# Patient Record
Sex: Male | Born: 1960
Health system: Southern US, Community
[De-identification: ages and names within clinical notes are randomized; demographics above are authoritative.]

## PROBLEM LIST (undated history)

## (undated) DIAGNOSIS — R011 Cardiac murmur, unspecified: Secondary | ICD-10-CM

## (undated) DIAGNOSIS — K219 Gastro-esophageal reflux disease without esophagitis: Secondary | ICD-10-CM

## (undated) DIAGNOSIS — G43909 Migraine, unspecified, not intractable, without status migrainosus: Secondary | ICD-10-CM

## (undated) DIAGNOSIS — E785 Hyperlipidemia, unspecified: Secondary | ICD-10-CM

## (undated) DIAGNOSIS — T7840XA Allergy, unspecified, initial encounter: Secondary | ICD-10-CM

## (undated) HISTORY — DX: Migraine, unspecified, not intractable, without status migrainosus: G43.909

## (undated) HISTORY — DX: Allergy, unspecified, initial encounter: T78.40XA

## (undated) HISTORY — PX: POLYPECTOMY: SHX149

## (undated) HISTORY — DX: Hyperlipidemia, unspecified: E78.5

## (undated) HISTORY — DX: Cardiac murmur, unspecified: R01.1

## (undated) HISTORY — PX: COLONOSCOPY: SHX174

## (undated) HISTORY — DX: Gastro-esophageal reflux disease without esophagitis: K21.9

---

## 2010-02-15 ENCOUNTER — Ambulatory Visit (HOSPITAL_COMMUNITY): Admission: RE | Admit: 2010-02-15 | Discharge: 2010-02-15 | Payer: Self-pay | Admitting: Ophthalmology

## 2010-02-22 HISTORY — PX: OTHER SURGICAL HISTORY: SHX169

## 2010-12-19 ENCOUNTER — Encounter: Payer: Self-pay | Admitting: Internal Medicine

## 2010-12-19 ENCOUNTER — Other Ambulatory Visit (INDEPENDENT_AMBULATORY_CARE_PROVIDER_SITE_OTHER): Payer: BC Managed Care – PPO

## 2010-12-19 ENCOUNTER — Ambulatory Visit (INDEPENDENT_AMBULATORY_CARE_PROVIDER_SITE_OTHER): Payer: BC Managed Care – PPO | Admitting: Internal Medicine

## 2010-12-19 ENCOUNTER — Other Ambulatory Visit: Payer: Self-pay | Admitting: Internal Medicine

## 2010-12-19 VITALS — BP 110/70 | HR 61 | Temp 98.1°F | Ht 68.0 in | Wt 161.0 lb

## 2010-12-19 DIAGNOSIS — R972 Elevated prostate specific antigen [PSA]: Secondary | ICD-10-CM | POA: Insufficient documentation

## 2010-12-19 DIAGNOSIS — Z0000 Encounter for general adult medical examination without abnormal findings: Secondary | ICD-10-CM

## 2010-12-19 DIAGNOSIS — E785 Hyperlipidemia, unspecified: Secondary | ICD-10-CM | POA: Insufficient documentation

## 2010-12-19 DIAGNOSIS — G43909 Migraine, unspecified, not intractable, without status migrainosus: Secondary | ICD-10-CM | POA: Insufficient documentation

## 2010-12-19 LAB — BASIC METABOLIC PANEL
CO2: 32 mEq/L (ref 19–32)
Calcium: 10.1 mg/dL (ref 8.4–10.5)
Creatinine, Ser: 1.1 mg/dL (ref 0.4–1.5)
Glucose, Bld: 83 mg/dL (ref 70–99)
Sodium: 143 mEq/L (ref 135–145)

## 2010-12-19 LAB — LIPID PANEL
Cholesterol: 275 mg/dL — ABNORMAL HIGH (ref 0–200)
Triglycerides: 72 mg/dL (ref 0.0–149.0)

## 2010-12-19 LAB — HEPATIC FUNCTION PANEL
ALT: 28 U/L (ref 0–53)
Albumin: 4.9 g/dL (ref 3.5–5.2)
Alkaline Phosphatase: 61 U/L (ref 39–117)
Total Protein: 7.6 g/dL (ref 6.0–8.3)

## 2010-12-19 LAB — CBC WITH DIFFERENTIAL/PLATELET
Basophils Absolute: 0 10*3/uL (ref 0.0–0.1)
Hemoglobin: 17.3 g/dL — ABNORMAL HIGH (ref 13.0–17.0)
Lymphocytes Relative: 29.2 % (ref 12.0–46.0)
Monocytes Relative: 4.1 % (ref 3.0–12.0)
Neutro Abs: 5.4 10*3/uL (ref 1.4–7.7)
Neutrophils Relative %: 63.7 % (ref 43.0–77.0)
RDW: 13.5 % (ref 11.5–14.6)

## 2010-12-19 LAB — URINALYSIS, ROUTINE W REFLEX MICROSCOPIC
Leukocytes, UA: NEGATIVE
Nitrite: NEGATIVE
Specific Gravity, Urine: 1.02 (ref 1.000–1.030)
Urobilinogen, UA: 0.2 (ref 0.0–1.0)

## 2010-12-19 LAB — TSH: TSH: 2.49 u[IU]/mL (ref 0.35–5.50)

## 2010-12-19 NOTE — Progress Notes (Signed)
  Subjective:    Patient ID: Wayne [name], male    DOB: 03-02-1961, 50 y.o.   MRN: [id number]  HPI  Here to establish;  Here for wellness  Overall doing ok;  Pt denies CP, worsening SOB, DOE, wheezing, orthopnea, PND, worsening LE edema, palpitations, dizziness or syncope.  Pt denies neurological change such as new Headache, facial or extremity weakness.  Pt denies polydipsia, polyuria, or low sugar symptoms. Pt states overall good compliance with treatment and medications, good tolerability, and trying to follow lower cholesterol diet.  Pt denies worsening depressive symptoms, suicidal ideation or panic. No fever, wt loss, night sweats, loss of appetite, or other constitutional symptoms.  Pt states good ability with ADL's, low fall risk, home safety reviewed and adequate, no significant changes in hearing or vision, and occasionally active with exercise. No other new complaints  Sees urology for PSA. Past Medical History  Diagnosis Date  . Migraines   . Hyperlipidemia    Past Surgical History  Procedure Date  . Ruptured right achilles repair 02/22/2010    reports that he has never smoked. He does not have any smokeless tobacco history on file. He reports that he drinks alcohol. He reports that he uses illicit drugs. family history includes Cancer in his father and mother and Hyperlipidemia in his father and mother. No Known Allergies No current outpatient prescriptions on file prior to visit.   Review of Systems Review of Systems  Constitutional: Negative for diaphoresis, activity change, appetite change and unexpected weight change.  HENT: Negative for hearing loss, ear pain, facial swelling, mouth sores and neck stiffness.   Eyes: Negative for pain, redness and visual disturbance.  Respiratory: Negative for shortness of breath and wheezing.   Cardiovascular: Negative for chest pain and palpitations.  Gastrointestinal: Negative for diarrhea, blood in stool, abdominal distention and rectal  pain.  Genitourinary: Negative for hematuria, flank pain and decreased urine volume.  Musculoskeletal: Negative for myalgias and joint swelling.  Skin: Negative for color change and wound.  Neurological: Negative for syncope and numbness.  Hematological: Negative for adenopathy.  Psychiatric/Behavioral: Negative for hallucinations, self-injury, decreased concentration and agitation.      Objective:   Physical Exam BP 110/70  Pulse 61  Temp(Src) 98.1 F (36.7 C) (Oral)  Ht 5\' 8"  (1.727 m)  Wt 161 lb (73.029 kg)  BMI 24.48 kg/m2  SpO2 97% Physical Exam  VS noted Constitutional: Pt is oriented to person, place, and time. Appears well-developed and well-nourished.  HENT:  Head: Normocephalic and atraumatic.  Right Ear: External ear normal.  Left Ear: External ear normal.  Nose: Nose normal.  Mouth/Throat: Oropharynx is clear and moist.  Eyes: Conjunctivae and EOM are normal. Pupils are equal, round, and reactive to light.  Neck: Normal range of motion. Neck supple. No JVD present. No tracheal deviation present.  Cardiovascular: Normal rate, regular rhythm, normal heart sounds and intact distal pulses.   Pulmonary/Chest: Effort normal and breath sounds normal.  Abdominal: Soft. Bowel sounds are normal. There is no tenderness.  Musculoskeletal: Normal range of motion. Exhibits no edema.  Lymphadenopathy:  Has no cervical adenopathy.  Neurological: Pt is alert and oriented to person, place, and time. Pt has normal reflexes. No cranial nerve deficit.  Skin: Skin is warm and dry. No rash noted.  Psychiatric:  Has  normal mood and affect. Behavior is normal.         Assessment & Plan:

## 2010-12-19 NOTE — Assessment & Plan Note (Signed)

## 2010-12-19 NOTE — Patient Instructions (Signed)
Continue all other medications as before Please go to LAB in the Basement for the blood and/or urine tests to be done today Please call the phone number [phone] (the PhoneTree System) for results of testing in 2-3 days;  When calling, simply dial the number, and when prompted enter the MRN number above (the Medical Record Number) and the # key, then the message should start. Please return in 1 year for your yearly visit, or sooner if needed, with Lab testing done 3-5 days before  

## 2011-03-22 ENCOUNTER — Other Ambulatory Visit: Payer: BC Managed Care – PPO

## 2011-07-25 ENCOUNTER — Telehealth: Payer: Self-pay

## 2011-07-25 DIAGNOSIS — Z0000 Encounter for general adult medical examination without abnormal findings: Secondary | ICD-10-CM

## 2011-07-25 NOTE — Telephone Encounter (Signed)
Put order in for labs. 

## 2011-12-21 ENCOUNTER — Other Ambulatory Visit: Payer: Self-pay | Admitting: Internal Medicine

## 2011-12-21 ENCOUNTER — Other Ambulatory Visit (INDEPENDENT_AMBULATORY_CARE_PROVIDER_SITE_OTHER): Payer: BC Managed Care – PPO

## 2011-12-21 ENCOUNTER — Ambulatory Visit (INDEPENDENT_AMBULATORY_CARE_PROVIDER_SITE_OTHER): Payer: BC Managed Care – PPO | Admitting: Internal Medicine

## 2011-12-21 ENCOUNTER — Encounter: Payer: Self-pay | Admitting: Internal Medicine

## 2011-12-21 VITALS — BP 102/72 | HR 60 | Temp 98.0°F | Ht 68.0 in | Wt 159.5 lb

## 2011-12-21 DIAGNOSIS — Z0000 Encounter for general adult medical examination without abnormal findings: Secondary | ICD-10-CM

## 2011-12-21 DIAGNOSIS — E785 Hyperlipidemia, unspecified: Secondary | ICD-10-CM

## 2011-12-21 DIAGNOSIS — R972 Elevated prostate specific antigen [PSA]: Secondary | ICD-10-CM

## 2011-12-21 DIAGNOSIS — L989 Disorder of the skin and subcutaneous tissue, unspecified: Secondary | ICD-10-CM | POA: Insufficient documentation

## 2011-12-21 LAB — HEPATIC FUNCTION PANEL
AST: 27 U/L (ref 0–37)
Albumin: 4.6 g/dL (ref 3.5–5.2)
Alkaline Phosphatase: 57 U/L (ref 39–117)
Bilirubin, Direct: 0.2 mg/dL (ref 0.0–0.3)
Total Protein: 7.2 g/dL (ref 6.0–8.3)

## 2011-12-21 LAB — URINALYSIS, ROUTINE W REFLEX MICROSCOPIC
Ketones, ur: NEGATIVE
Leukocytes, UA: NEGATIVE
Specific Gravity, Urine: 1.02 (ref 1.000–1.030)
Urine Glucose: NEGATIVE
Urobilinogen, UA: 0.2 (ref 0.0–1.0)

## 2011-12-21 LAB — CBC WITH DIFFERENTIAL/PLATELET
Basophils Absolute: 0 10*3/uL (ref 0.0–0.1)
Eosinophils Absolute: 0.2 10*3/uL (ref 0.0–0.7)
HCT: 50 % (ref 39.0–52.0)
Lymphocytes Relative: 31.2 % (ref 12.0–46.0)
Lymphs Abs: 2.2 10*3/uL (ref 0.7–4.0)
MCHC: 33.6 g/dL (ref 30.0–36.0)
Monocytes Relative: 4.8 % (ref 3.0–12.0)
Neutro Abs: 4.3 10*3/uL (ref 1.4–7.7)
Platelets: 242 10*3/uL (ref 150.0–400.0)
RDW: 12.7 % (ref 11.5–14.6)

## 2011-12-21 LAB — BASIC METABOLIC PANEL
CO2: 30 mEq/L (ref 19–32)
Glucose, Bld: 92 mg/dL (ref 70–99)
Potassium: 4.7 mEq/L (ref 3.5–5.1)
Sodium: 140 mEq/L (ref 135–145)

## 2011-12-21 LAB — LIPID PANEL: Total CHOL/HDL Ratio: 3

## 2011-12-21 LAB — TSH: TSH: 1.53 u[IU]/mL (ref 0.35–5.50)

## 2011-12-21 MED ORDER — ATORVASTATIN CALCIUM 20 MG PO TABS: 20.0000 mg | ORAL_TABLET | Freq: Every day | ORAL | Status: DC

## 2011-12-21 MED ORDER — ASPIRIN 81 MG PO TBEC: 81.0000 mg | DELAYED_RELEASE_TABLET | Freq: Every day | ORAL | Status: AC

## 2011-12-21 NOTE — Assessment & Plan Note (Addendum)

## 2011-12-21 NOTE — Assessment & Plan Note (Signed)
Mild dark, enlarging, slight elevation - ok for refer to derm per pt request

## 2011-12-21 NOTE — Assessment & Plan Note (Signed)
To consider statin for ldl > 100 

## 2011-12-21 NOTE — Assessment & Plan Note (Signed)
Has not seen urology in > 1 yr - for f/u PSA, consider re-referral

## 2011-12-21 NOTE — Patient Instructions (Addendum)
Please start Aspirin 81 mg - 1 per day - Enteric Coated only You will be contacted regarding the referral for: colonoscopy, and dermatology Please go to LAB in the Basement for the blood and/or urine tests to be done today You will be contacted by phone if any changes need to be made immediately.  Otherwise, you will receive a letter about your results with an explanation. If the PSA is elevated, we will likely need referral back to urology Please return in 1 year for your yearly visit, or sooner if needed, with Lab testing done 3-5 days before

## 2011-12-22 ENCOUNTER — Encounter: Payer: Self-pay | Admitting: Internal Medicine

## 2011-12-22 NOTE — Progress Notes (Signed)
Subjective:    Patient ID: Wayne [name], male    DOB: Apr 29, 1961, 51 y.o.   MRN: [id number]  HPI  Here for wellness and f/u;  Overall doing ok;  Pt denies CP, worsening SOB, DOE, wheezing, orthopnea, PND, worsening LE edema, palpitations, dizziness or syncope.  Pt denies neurological change such as new Headache, facial or extremity weakness.  Pt denies polydipsia, polyuria, or low sugar symptoms. Pt states overall good compliance with treatment and medications, good tolerability, and trying to follow lower cholesterol diet.  Pt denies worsening depressive symptoms, suicidal ideation or panic. No fever, wt loss, night sweats, loss of appetite, or other constitutional symptoms.  Pt states good ability with ADL's, low fall risk, home safety reviewed and adequate, no significant changes in hearing or vision, and occasionally active with exercise.  Has a dark skin lesion to the right arm enlarging in the past month and asks for derm referral. Past Medical History  Diagnosis Date  . Migraines   . Hyperlipidemia    Past Surgical History  Procedure Date  . Ruptured right achilles repair 02/22/2010    reports that he has never smoked. He has never used smokeless tobacco. He reports that he drinks alcohol. He reports that he does not use illicit drugs. family history includes Cancer in his father and mother and Hyperlipidemia in his father and mother. No Known Allergies Current Outpatient Prescriptions on File Prior to Visit  Medication Sig Dispense Refill  . atorvastatin (LIPITOR) 20 MG tablet Take 1 tablet (20 mg total) by mouth daily.  90 tablet  3   Review of Systems Review of Systems  Constitutional: Negative for diaphoresis, activity change, appetite change and unexpected weight change.  HENT: Negative for hearing loss, ear pain, facial swelling, mouth sores and neck stiffness.   Eyes: Negative for pain, redness and visual disturbance.  Respiratory: Negative for shortness of breath and wheezing.    Cardiovascular: Negative for chest pain and palpitations.  Gastrointestinal: Negative for diarrhea, blood in stool, abdominal distention and rectal pain.  Genitourinary: Negative for hematuria, flank pain and decreased urine volume.  Musculoskeletal: Negative for myalgias and joint swelling.  Skin: Negative for color change and wound.  Neurological: Negative for syncope and numbness.  Hematological: Negative for adenopathy.  Psychiatric/Behavioral: Negative for hallucinations, self-injury, decreased concentration and agitation.      Objective:   Physical Exam BP 102/72  Pulse 60  Temp 98 F (36.7 C) (Oral)  Ht 5\' 8"  (1.727 m)  Wt 159 lb 8 oz (72.349 kg)  BMI 24.25 kg/m2  SpO2 97% Physical Exam  VS noted Constitutional: Pt is oriented to person, place, and time. Appears well-developed and well-nourished.  HENT:  Head: Normocephalic and atraumatic.  Right Ear: External ear normal.  Left Ear: External ear normal.  Nose: Nose normal.  Mouth/Throat: Oropharynx is clear and moist.  Eyes: Conjunctivae and EOM are normal. Pupils are equal, round, and reactive to light.  Neck: Normal range of motion. Neck supple. No JVD present. No tracheal deviation present.  Cardiovascular: Normal rate, regular rhythm, normal heart sounds and intact distal pulses.   Pulmonary/Chest: Effort normal and breath sounds normal.  Abdominal: Soft. Bowel sounds are normal. There is no tenderness.  Musculoskeletal: Normal range of motion. Exhibits no edema.  Lymphadenopathy:  Has no cervical adenopathy.  Neurological: Pt is alert and oriented to person, place, and time. Pt has normal reflexes. No cranial nerve deficit.  Skin: Skin is warm and dry. No rash noted.  right med anterior arm with 8 mm raised dark lesion, non ulcerated Psychiatric:  Has  normal mood and affect. Behavior is normal.     Assessment & Plan:

## 2011-12-25 ENCOUNTER — Encounter: Payer: Self-pay | Admitting: Internal Medicine

## 2012-02-08 ENCOUNTER — Ambulatory Visit (AMBULATORY_SURGERY_CENTER): Payer: BC Managed Care – PPO | Admitting: *Deleted

## 2012-02-08 ENCOUNTER — Encounter: Payer: Self-pay | Admitting: Internal Medicine

## 2012-02-08 VITALS — Ht 68.0 in | Wt 167.7 lb

## 2012-02-08 DIAGNOSIS — Z1211 Encounter for screening for malignant neoplasm of colon: Secondary | ICD-10-CM

## 2012-02-08 MED ORDER — NA SULFATE-K SULFATE-MG SULF 17.5-3.13-1.6 GM/177ML PO SOLN: ORAL | Status: DC

## 2012-02-20 ENCOUNTER — Telehealth: Payer: Self-pay | Admitting: Internal Medicine

## 2012-02-20 NOTE — Telephone Encounter (Signed)
Pt called as he was reviewing his prep instructions and realized he was to stop eating certain foods starting Saturday. He has been eating restricted foods until last evening. Advised not to eat any of these foods any further and keep appointment as scheduled for Friday.

## 2012-02-22 ENCOUNTER — Ambulatory Visit (AMBULATORY_SURGERY_CENTER): Payer: BC Managed Care – PPO | Admitting: Internal Medicine

## 2012-02-22 ENCOUNTER — Encounter: Payer: Self-pay | Admitting: Internal Medicine

## 2012-02-22 VITALS — BP 125/77 | HR 78 | Temp 98.2°F | Resp 18 | Ht 68.0 in | Wt 167.0 lb

## 2012-02-22 DIAGNOSIS — Z1211 Encounter for screening for malignant neoplasm of colon: Secondary | ICD-10-CM

## 2012-02-22 DIAGNOSIS — K649 Unspecified hemorrhoids: Secondary | ICD-10-CM

## 2012-02-22 DIAGNOSIS — D126 Benign neoplasm of colon, unspecified: Secondary | ICD-10-CM

## 2012-02-22 MED ORDER — HYDROCORTISONE ACETATE 25 MG RE SUPP: 25.0000 mg | Freq: Two times a day (BID) | RECTAL | Status: AC

## 2012-02-22 MED ORDER — SODIUM CHLORIDE 0.9 % IV SOLN: 500.0000 mL | INTRAVENOUS | Status: DC

## 2012-02-22 NOTE — Patient Instructions (Addendum)

## 2012-02-22 NOTE — Op Note (Signed)
[facility] [address] [place], [address]   COLONOSCOPY PROCEDURE REPORT  PATIENT: Wayne, [name]  MR#: [id number] BIRTHDATE: December 01, 1960 , 51  yrs. old GENDER: Male ENDOSCOPIST: Beverley Fiedler, MD REFERRED IO:NGEX, Fayrene Fearing PROCEDURE DATE:  02/22/2012 PROCEDURE:   Colonoscopy with cold biopsy polypectomy ASA CLASS:   Class I INDICATIONS:average risk screening and first colonoscopy. MEDICATIONS: MAC sedation, administered by CRNA and propofol (Diprivan) 200mg  IV  DESCRIPTION OF PROCEDURE:   After the risks benefits and alternatives of the procedure were thoroughly explained, informed consent was obtained.  A digital rectal exam revealed external hemorrhoids.   The LB [id number]  endoscope was introduced through the anus and advanced to the cecum, which was identified by both the appendix and ileocecal valve. No adverse events experienced.   The quality of the prep was Suprep good  The instrument was then slowly withdrawn as the colon was fully examined.   COLON FINDINGS: A sessile polyp measuring 2 mm in size was found in the ascending colon.  A polypectomy was performed with cold forceps.  The resection was complete and the polyp tissue was completely retrieved.   Moderate sized internal and external hemorrhoids were found.  Retroflexed views revealed internal/external hemorrhoids. The time to cecum=2 minutes 06 seconds.  Withdrawal time=8 minutes 19 seconds.  The scope was withdrawn and the procedure completed. COMPLICATIONS: There were no complications.  ENDOSCOPIC IMPRESSION: 1.   Sessile polyp measuring 2 mm in size was found in the ascending colon; polypectomy was performed with cold forceps 2.   Moderate sized internal and external hemorrhoids  RECOMMENDATIONS: 1.  Await pathology results 2.  If the polyp removed today is proven to be an adenomatous (pre-cancerous) polyp, you will need a repeat colonoscopy in 5 years.  Otherwise you should  continue to follow colorectal cancer screening guidelines for "routine risk" patients with colonoscopy in 10 years.  You will receive a letter within 1-2 weeks with the results of your biopsy as well as final recommendations.  Please call my office if you have not received a letter after 3 weeks. 3.  If needed, hydrocortisone suppository 25 mg twice daily for 5 -7 days can be used to treat hemorrhoids.   eSigned:  Beverley Fiedler, MD 02/22/2012 11:06 AM   cc: Corwin Levins, MD and The Patient   PATIENT NAME:  Wayne, [name] MR#: [id number]

## 2012-02-22 NOTE — Progress Notes (Signed)
Patient did not experience any of the following events: a burn prior to discharge; a fall within the facility; wrong site/side/patient/procedure/implant event; or a hospital transfer or hospital admission upon discharge from the facility. (G8907) Patient did not have preoperative order for IV antibiotic SSI prophylaxis. (G8918)  

## 2012-02-25 ENCOUNTER — Telehealth: Payer: Self-pay

## 2012-02-25 NOTE — Telephone Encounter (Signed)
  Follow up Call-  Call back number 02/22/2012  Post procedure Call Back phone  # [phone]  Permission to leave phone message Yes     Patient questions:  Do you have a fever, pain , or abdominal swelling? no Pain Score  0 *  Have you tolerated food without any problems? yes  Have you been able to return to your normal activities? yes  Do you have any questions about your discharge instructions: Diet   no Medications  no Follow up visit  no  Do you have questions or concerns about your Care? no  Actions: * If pain score is 4 or above: No action needed, pain <4.

## 2012-02-26 ENCOUNTER — Encounter: Payer: Self-pay | Admitting: Internal Medicine

## 2012-12-26 ENCOUNTER — Ambulatory Visit (INDEPENDENT_AMBULATORY_CARE_PROVIDER_SITE_OTHER): Payer: BC Managed Care – PPO | Admitting: Internal Medicine

## 2012-12-26 ENCOUNTER — Encounter: Payer: Self-pay | Admitting: Internal Medicine

## 2012-12-26 ENCOUNTER — Other Ambulatory Visit (INDEPENDENT_AMBULATORY_CARE_PROVIDER_SITE_OTHER): Payer: BC Managed Care – PPO

## 2012-12-26 DIAGNOSIS — E785 Hyperlipidemia, unspecified: Secondary | ICD-10-CM

## 2012-12-26 DIAGNOSIS — L989 Disorder of the skin and subcutaneous tissue, unspecified: Secondary | ICD-10-CM

## 2012-12-26 DIAGNOSIS — Z0000 Encounter for general adult medical examination without abnormal findings: Secondary | ICD-10-CM

## 2012-12-26 LAB — LIPID PANEL
Cholesterol: 179 mg/dL (ref 0–200)
HDL: 73.3 mg/dL (ref >39.00)
Total CHOL/HDL Ratio: 2
Triglycerides: 49 mg/dL (ref 0.0–149.0)

## 2012-12-26 LAB — CBC WITH DIFFERENTIAL/PLATELET
Basophils Relative: 0.3 % (ref 0.0–3.0)
Eosinophils Absolute: 0.3 10*3/uL (ref 0.0–0.7)
Hemoglobin: 17.4 g/dL — ABNORMAL HIGH (ref 13.0–17.0)
Lymphocytes Relative: 26.4 % (ref 12.0–46.0)
MCHC: 34.1 g/dL (ref 30.0–36.0)
Monocytes Relative: 4.9 % (ref 3.0–12.0)
Neutro Abs: 5.2 10*3/uL (ref 1.4–7.7)
Neutrophils Relative %: 64.5 % (ref 43.0–77.0)
RBC: 5.57 Mil/uL (ref 4.22–5.81)
WBC: 8.1 10*3/uL (ref 4.5–10.5)

## 2012-12-26 LAB — BASIC METABOLIC PANEL
BUN: 14 mg/dL (ref 6–23)
CO2: 33 mEq/L — ABNORMAL HIGH (ref 19–32)
Calcium: 10 mg/dL (ref 8.4–10.5)
Chloride: 102 mEq/L (ref 96–112)
Creatinine, Ser: 1.3 mg/dL (ref 0.4–1.5)
Glucose, Bld: 81 mg/dL (ref 70–99)

## 2012-12-26 LAB — URINALYSIS, ROUTINE W REFLEX MICROSCOPIC
Hgb urine dipstick: NEGATIVE
Leukocytes, UA: NEGATIVE
RBC / HPF: NONE SEEN (ref 0–?)
Specific Gravity, Urine: 1.025 (ref 1.000–1.030)
Urobilinogen, UA: 0.2 (ref 0.0–1.0)

## 2012-12-26 LAB — HEPATIC FUNCTION PANEL
ALT: 27 U/L (ref 0–53)
Albumin: 4.4 g/dL (ref 3.5–5.2)
Total Protein: 6.7 g/dL (ref 6.0–8.3)

## 2012-12-26 MED ORDER — ASPIRIN EC 81 MG PO TBEC: 81.0000 mg | DELAYED_RELEASE_TABLET | Freq: Every day | ORAL | Status: AC

## 2012-12-26 NOTE — Progress Notes (Signed)
Subjective:    Patient ID: Wayne [name], male    DOB: 1960/06/08, 52 y.o.   MRN: [id number]  HPI   Here for wellness and f/u;  Overall doing ok;  Pt denies CP, worsening SOB, DOE, wheezing, orthopnea, PND, worsening LE edema, palpitations, dizziness or syncope.  Pt denies neurological change such as new headache, facial or extremity weakness.  Pt denies polydipsia, polyuria, or low sugar symptoms. Pt states overall good compliance with treatment and medications, good tolerability, and has been trying to follow lower cholesterol diet.  Pt denies worsening depressive symptoms, suicidal ideation or panic. No fever, night sweats, wt loss, loss of appetite, or other constitutional symptoms.  Pt states good ability with ADL's, has low fall risk, home safety reviewed and adequate, no other significant changes in hearing or vision, and only occasionally active with exercise. Tolerating the lipitor well.  Has had some aches in muscles more than usual, and occas bruising.  Wants to try few mo trial off the lipitor to see how he can control with diet. Also has increased size of brown/black lesion to right forearm, asks for derm referral Past Medical History  Diagnosis Date  . Migraines   . Hyperlipidemia    Past Surgical History  Procedure Laterality Date  . Ruptured right achilles repair  02/22/2010    reports that he has never smoked. He has never used smokeless tobacco. He reports that he drinks about 1.8 ounces of alcohol per week. He reports that he does not use illicit drugs. family history includes Cancer in his father and mother and Hyperlipidemia in his father and mother.  There is no history of Colon cancer and Stomach cancer. No Known Allergies Current Outpatient Prescriptions on File Prior to Visit  Medication Sig Dispense Refill  . cholecalciferol (VITAMIN D) 1000 UNITS tablet Take 1,000 Units by mouth daily.      Marland Kitchen KRILL OIL PO Take by mouth daily.      . Misc Natural Products (GINSENG  COMPLEX PO) Take by mouth daily.      . Multiple Vitamin (MULTIVITAMIN) tablet Take 1 tablet by mouth daily.      Marland Kitchen atorvastatin (LIPITOR) 20 MG tablet Take 1 tablet (20 mg total) by mouth daily.  90 tablet  3   No current facility-administered medications on file prior to visit.   Review of Systems Constitutional: Negative for diaphoresis, activity change, appetite change or unexpected weight change.  HENT: Negative for hearing loss, ear pain, facial swelling, mouth sores and neck stiffness.   Eyes: Negative for pain, redness and visual disturbance.  Respiratory: Negative for shortness of breath and wheezing.   Cardiovascular: Negative for chest pain and palpitations.  Gastrointestinal: Negative for diarrhea, blood in stool, abdominal distention or other pain Genitourinary: Negative for hematuria, flank pain or change in urine volume.  Musculoskeletal: Negative for myalgias and joint swelling.  Skin: Negative for color change and wound.  Neurological: Negative for syncope and numbness. other than noted Hematological: Negative for adenopathy.  Psychiatric/Behavioral: Negative for hallucinations, self-injury, decreased concentration and agitation.      Objective:   Physical Exam There were no vitals taken for this visit. VS noted,  Constitutional: Pt is oriented to person, place, and time. Appears well-developed and well-nourished.  Head: Normocephalic and atraumatic.  Right Ear: External ear normal.  Left Ear: External ear normal.  Nose: Nose normal.  Mouth/Throat: Oropharynx is clear and moist.  Eyes: Conjunctivae and EOM are normal. Pupils are equal, round, and reactive  to light.  Neck: Normal range of motion. Neck supple. No JVD present. No tracheal deviation present.  Cardiovascular: Normal rate, regular rhythm, normal heart sounds and intact distal pulses.   Pulmonary/Chest: Effort normal and breath sounds normal.  Abdominal: Soft. Bowel sounds are normal. There is no  tenderness. No HSM  Musculoskeletal: Normal range of motion. Exhibits no edema.  Lymphadenopathy:  Has no cervical adenopathy.  Neurological: Pt is alert and oriented to person, place, and time. Pt has normal reflexes. No cranial nerve deficit.  Skin: Skin is warm and dry. No rash noted. Has 8-9 mm raised brown/black lesion ant right mid forearm approx 2 cm prox to wrist Psychiatric:  Has  normal mood and affect. Behavior is normal.     Assessment & Plan:

## 2012-12-26 NOTE — Assessment & Plan Note (Signed)
?    Benign vs other- for derm referral

## 2012-12-26 NOTE — Patient Instructions (Addendum)
Please go to the LAB in the Basement (turn left off the elevator) for the tests to be done today OK to hold on taking the Lipitor for 3 months Then return for LAB only again in 3 months - (the cholesterol repeat level) You will be contacted by phone if any changes need to be made immediately.  Otherwise, you will receive a letter about your results with an explanation, but please check with MyChart first. Please remember to sign up for My Chart if you have not done so, as this will be important to you in the future with finding out test results, communicating by private email, and scheduling acute appointments online when needed. Please also start Aspirin 81 mg - 1 per day - COATED only to help reduce risk of stroke and heart disease Otherwise , Please continue all other medications as before, and refills have been done if requested. Please have the pharmacy call with any other refills you may need. Please continue your efforts at being more active, low cholesterol diet, and weight control. You are otherwise up to date with prevention measures today.  You will be contacted regarding the referral for: dermatology  Please return in 1 year for your yearly visit, or sooner if needed, with Lab testing done 3-5 days before

## 2012-12-26 NOTE — Assessment & Plan Note (Signed)
Pt asks for trial off lipitor for 3 months, then f/u lipids, to cont diet as well

## 2012-12-26 NOTE — Assessment & Plan Note (Signed)

## 2013-07-21 ENCOUNTER — Ambulatory Visit: Payer: BC Managed Care – PPO | Admitting: Internal Medicine

## 2013-07-23 ENCOUNTER — Ambulatory Visit: Payer: BC Managed Care – PPO | Admitting: Internal Medicine

## 2013-07-29 ENCOUNTER — Ambulatory Visit (INDEPENDENT_AMBULATORY_CARE_PROVIDER_SITE_OTHER): Payer: BC Managed Care – PPO | Admitting: Internal Medicine

## 2013-07-29 ENCOUNTER — Other Ambulatory Visit (INDEPENDENT_AMBULATORY_CARE_PROVIDER_SITE_OTHER): Payer: BC Managed Care – PPO

## 2013-07-29 ENCOUNTER — Other Ambulatory Visit: Payer: Self-pay | Admitting: Internal Medicine

## 2013-07-29 ENCOUNTER — Encounter: Payer: Self-pay | Admitting: Internal Medicine

## 2013-07-29 VITALS — BP 120/78 | HR 87 | Temp 97.6°F | Ht 68.0 in | Wt 162.2 lb

## 2013-07-29 DIAGNOSIS — R61 Generalized hyperhidrosis: Secondary | ICD-10-CM | POA: Insufficient documentation

## 2013-07-29 DIAGNOSIS — R1030 Lower abdominal pain, unspecified: Secondary | ICD-10-CM

## 2013-07-29 DIAGNOSIS — R109 Unspecified abdominal pain: Secondary | ICD-10-CM

## 2013-07-29 DIAGNOSIS — L989 Disorder of the skin and subcutaneous tissue, unspecified: Secondary | ICD-10-CM

## 2013-07-29 DIAGNOSIS — E785 Hyperlipidemia, unspecified: Secondary | ICD-10-CM

## 2013-07-29 DIAGNOSIS — N508 Other specified disorders of male genital organs: Secondary | ICD-10-CM

## 2013-07-29 DIAGNOSIS — N5319 Other ejaculatory dysfunction: Secondary | ICD-10-CM

## 2013-07-29 LAB — URINALYSIS, ROUTINE W REFLEX MICROSCOPIC
BILIRUBIN URINE: NEGATIVE
Hgb urine dipstick: NEGATIVE
KETONES UR: NEGATIVE
LEUKOCYTES UA: NEGATIVE
Nitrite: NEGATIVE
SPECIFIC GRAVITY, URINE: 1.02 (ref 1.000–1.030)
Total Protein, Urine: NEGATIVE
URINE GLUCOSE: NEGATIVE
Urobilinogen, UA: 0.2 (ref 0.0–1.0)
pH: 6 (ref 5.0–8.0)

## 2013-07-29 LAB — LIPID PANEL
Cholesterol: 268 mg/dL — ABNORMAL HIGH (ref 0–200)
HDL: 81.5 mg/dL (ref >39.00)
LDL Cholesterol: 169 mg/dL — ABNORMAL HIGH (ref 0–99)
TRIGLYCERIDES: 90 mg/dL (ref 0.0–149.0)
Total CHOL/HDL Ratio: 3
VLDL: 18 mg/dL (ref 0.0–40.0)

## 2013-07-29 MED ORDER — ATORVASTATIN CALCIUM 20 MG PO TABS: 20.0000 mg | ORAL_TABLET | Freq: Every day | ORAL | Status: AC

## 2013-07-29 MED ORDER — ALUMINUM CHLORIDE 20 % EX SOLN: Freq: Every day | CUTANEOUS | Status: AC

## 2013-07-29 NOTE — Patient Instructions (Signed)
Please take all new medication as prescribed - the drysol  Please continue all other medications as before, and refills have been done if requested. Please have the pharmacy call with any other refills you may need.  Please go to the LAB in the Basement (turn left off the elevator) for the tests to be done today You will be contacted by phone if any changes need to be made immediately.  Otherwise, you will receive a letter about your results with an explanation, but please check with MyChart first.  You will be contacted regarding the referral for: dermatology

## 2013-07-29 NOTE — Assessment & Plan Note (Signed)
For drysol prn,  to f/u any worsening symptoms or concerns

## 2013-07-29 NOTE — Progress Notes (Signed)
Pre visit review using our clinic review tool, if applicable. No additional management support is needed unless otherwise documented below in the visit note. 

## 2013-07-29 NOTE — Assessment & Plan Note (Signed)
Suspect some element of retrograde ejaculation, reassured,  to f/u any worsening symptoms or concerns

## 2013-07-29 NOTE — Assessment & Plan Note (Signed)
stable overall by history and exam,  and pt to continue medical treatment as before,  to f/u any worsening symptoms or concerns, for f/u lab, consider statin, follow lower chol diet

## 2013-07-29 NOTE — Assessment & Plan Note (Signed)
?   Lymphadenitis vs msk most likely, no hernia, to check UA,  to f/u any worsening symptoms or concerns

## 2013-07-29 NOTE — Assessment & Plan Note (Signed)
Ok for derm referral 

## 2013-07-29 NOTE — Progress Notes (Signed)
   Subjective:    Patient ID: Wayne [name], male    DOB: 09-Sep-1960, 53 y.o.   MRN: [id number]  HPI  Here with acute onset mild to mod pain left groin x 2 wks after epipsode of lifting heavy car jack, no swelling but still painful, has noticed also some small ejaculate unsual for him in the underwear after more sexually active recetnly. Denies urinary symptoms such as dysuria, frequency, urgency, flank pain, hematuria or n/v, fever, chills. Pt denies chest pain, increased sob or doe, wheezing, orthopnea, PND, increased LE swelling, palpitations, dizziness or syncope.  Occas night sweat and daily excessive sweating ongoing for months as well. Denies worsening depressive symptoms, suicidal ideation, or panic; has ongoing stressors Past Medical History  Diagnosis Date  . Migraines   . Hyperlipidemia    Past Surgical History  Procedure Laterality Date  . Ruptured right achilles repair  02/22/2010    reports that he has never smoked. He has never used smokeless tobacco. He reports that he drinks about 1.8 ounces of alcohol per week. He reports that he does not use illicit drugs. family history includes Cancer in his father and mother; Hyperlipidemia in his father and mother. There is no history of Colon cancer or Stomach cancer. No Known Allergies Current Outpatient Prescriptions on File Prior to Visit  Medication Sig Dispense Refill  . aspirin EC 81 MG tablet Take 1 tablet (81 mg total) by mouth daily.  90 tablet  11  . cholecalciferol (VITAMIN D) 1000 UNITS tablet Take 1,000 Units by mouth daily.      Marland Kitchen KRILL OIL PO Take by mouth daily.      . Misc Natural Products (GINSENG COMPLEX PO) Take by mouth daily.      . Multiple Vitamin (MULTIVITAMIN) tablet Take 1 tablet by mouth daily.       No current facility-administered medications on file prior to visit.   Review of Systems  Constitutional: Negative for unexpected weight change, or unusual diaphoresis  HENT: Negative for tinnitus.   Eyes:  Negative for photophobia and visual disturbance.  Respiratory: Negative for choking and stridor.   Gastrointestinal: Negative for vomiting and blood in stool.  Genitourinary: Negative for hematuria and decreased urine volume.  Musculoskeletal: Negative for acute joint swelling Skin: Negative for color change and wound.  Neurological: Negative for tremors and numbness other than noted  Psychiatric/Behavioral: Negative for decreased concentration or  hyperactivity.       Objective:   Physical Exam BP 120/78  Pulse 87  Temp(Src) 97.6 F (36.4 C) (Oral)  Ht 5\' 8"  (1.727 m)  Wt 162 lb 4 oz (73.596 kg)  BMI 24.68 kg/m2  SpO2 97% VS noted,  Constitutional: Pt appears well-developed and well-nourished.  HENT: Head: NCAT.  Right Ear: External ear normal.  Left Ear: External ear normal.  Eyes: Conjunctivae and EOM are normal. Pupils are equal, round, and reactive to light.  Neck: Normal range of motion. Neck supple.  Cardiovascular: Normal rate and regular rhythm.   Pulmonary/Chest: Effort normal and breath sounds normal.  Abd:  Soft, NT, non-distended, + BS Neurological: Pt is alert. Not confused  Skin: Skin is warm. No erythema. Right ant mid arm with 8-9 mm dark slight raised lesion Psychiatric: Pt behavior is normal. Thought content normal.  GU: few small mild tender LA noted left inguinal area, no hernia swelling, penis/scrotal contents WNL - no red/swelling/tender/ulcer/rash    Assessment & Plan:

## 2013-11-24 ENCOUNTER — Ambulatory Visit (INDEPENDENT_AMBULATORY_CARE_PROVIDER_SITE_OTHER): Payer: BC Managed Care – PPO | Admitting: Internal Medicine

## 2013-11-24 ENCOUNTER — Encounter: Payer: Self-pay | Admitting: Internal Medicine

## 2013-11-24 VITALS — BP 120/82 | HR 73 | Temp 98.5°F | Ht 68.0 in | Wt 164.1 lb

## 2013-11-24 DIAGNOSIS — M19041 Primary osteoarthritis, right hand: Secondary | ICD-10-CM | POA: Insufficient documentation

## 2013-11-24 DIAGNOSIS — M19049 Primary osteoarthritis, unspecified hand: Secondary | ICD-10-CM

## 2013-11-24 DIAGNOSIS — K219 Gastro-esophageal reflux disease without esophagitis: Secondary | ICD-10-CM | POA: Insufficient documentation

## 2013-11-24 DIAGNOSIS — G43909 Migraine, unspecified, not intractable, without status migrainosus: Secondary | ICD-10-CM

## 2013-11-24 HISTORY — DX: Migraine, unspecified, not intractable, without status migrainosus: G43.909

## 2013-11-24 MED ORDER — SUMATRIPTAN SUCCINATE 100 MG PO TABS: ORAL_TABLET | ORAL | Status: DC

## 2013-11-24 MED ORDER — RANITIDINE HCL 150 MG PO TABS: 150.0000 mg | ORAL_TABLET | Freq: Every day | ORAL | Status: AC

## 2013-11-24 NOTE — Patient Instructions (Signed)
Please take all new medication as prescribed - the imitrex for migraine as needed, and zantac as night  Please continue all other medications as before, and refills have been done if requested.  Please have the pharmacy call with any other refills you may need.  Please continue your efforts at being more active, low cholesterol diet, and weight control.  Please keep your appointments with your specialists as you may have planned

## 2013-11-24 NOTE — Progress Notes (Signed)
Subjective:    Patient ID: Wayne [name], male    DOB: May 31, 1960, 53 y.o.   MRN: [id number]  HPI  Here to fu with hx of migraine since 53 yo, had 3-4 per yr where he was forced to spend day in bed, last episode last thur, missed work, family urged him to come in today, no obvious triggers in the past, not better with excedrin migraine or other OTC.  Usual HA responds to otc treatment.  More stress lately with worrying about parent health isuses, they live in [place].   Pt denies fever, wt loss, night sweats, loss of appetite, or other constitutional symptoms.  Denies worsening depressive symptoms, suicidal ideation, or panic.  No increased ETOH , none in fact for the past wk, occas beer at one per dya, only buys on sale. No other med changes.  No hx of head trauma. .Pt denies new neurological symptoms such as new headache, or facial or extremity weakness or numbness other than above.  Does have several wks ongoing nasal allergy symptoms with clearish congestion, itch and sneezing, without fever, pain, ST, cough, swelling or wheezing. Past Medical History  Diagnosis Date  . Migraines   . Hyperlipidemia   . Migraine, unspecified, without mention of intractable migraine without mention of status migrainosus 11/24/2013   Past Surgical History  Procedure Laterality Date  . Ruptured right achilles repair  02/22/2010    reports that he has never smoked. He has never used smokeless tobacco. He reports that he drinks about 1.8 ounces of alcohol per week. He reports that he does not use illicit drugs. family history includes Cancer in his father and mother; Hyperlipidemia in his father and mother. There is no history of Colon cancer or Stomach cancer. No Known Allergies Current Outpatient Prescriptions on File Prior to Visit  Medication Sig Dispense Refill  . aluminum chloride (DRYSOL) 20 % external solution Apply topically at bedtime.  60 mL  1  . aspirin EC 81 MG tablet Take 1 tablet (81 mg total) by  mouth daily.  90 tablet  11  . atorvastatin (LIPITOR) 20 MG tablet Take 1 tablet (20 mg total) by mouth daily.  90 tablet  3  . cholecalciferol (VITAMIN D) 1000 UNITS tablet Take 1,000 Units by mouth daily.      . Coenzyme Q10 (COQ10 PO) Take by mouth.      . Ginger, Zingiber officinalis, (GINGER PO) Take by mouth daily.      Marland Kitchen KRILL OIL PO Take by mouth daily.      . Misc Natural Products (GINSENG COMPLEX PO) Take by mouth daily.      . Multiple Vitamin (MULTIVITAMIN) tablet Take 1 tablet by mouth daily.      . Omega-3 Fatty Acids (OMEGA-3 FISH OIL PO) Take by mouth.       No current facility-administered medications on file prior to visit.   Review of Systems  Constitutional: Negative for unusual diaphoresis or other sweats  HENT: Negative for ringing in ear Eyes: Negative for double vision or worsening visual disturbance.  Respiratory: Negative for choking and stridor.   Gastrointestinal: Negative for vomiting or other signifcant bowel change Genitourinary: Negative for hematuria or decreased urine volume.  Musculoskeletal: Negative for other MSK pain or swelling Skin: Negative for color change and worsening wound.  Neurological: Negative for tremors and numbness other than noted  Psychiatric/Behavioral: Negative for decreased concentration or agitation other than above       Objective:  Physical Exam BP 120/82  Pulse 73  Temp(Src) 98.5 F (36.9 C) (Oral)  Ht 5\' 8"  (1.727 m)  Wt 164 lb 2 oz (74.447 kg)  BMI 24.96 kg/m2  SpO2 97% VS noted,  Constitutional: Pt appears well-developed, well-nourished.  HENT: Head: NCAT.  Right Ear: External ear normal.  Left Ear: External ear normal.  Eyes: . Pupils are equal, round, and reactive to light. Conjunctivae and EOM are normal Neck: Normal range of motion. Neck supple.  Cardiovascular: Normal rate and regular rhythm.   Pulmonary/Chest: Effort normal and breath sounds normal.  Abd:  Soft, NT, ND, + BS Neurological: Pt is alert.  Not confused , motor grossly intact, cn 2-12 intact Skin: Skin is warm. No rash Psychiatric: Pt behavior is normal. No agitation.  Right first MCP with tender soft and bony tissue swelling    Assessment & Plan:

## 2013-11-24 NOTE — Progress Notes (Signed)
Pre visit review using our clinic review tool, if applicable. No additional management support is needed unless otherwise documented below in the visit note. 

## 2013-11-30 NOTE — Assessment & Plan Note (Signed)
Ok for zantac 150 qhs

## 2013-11-30 NOTE — Assessment & Plan Note (Signed)
Ok for imitrex prn,  to f/u any worsening symptoms or concerns  

## 2013-11-30 NOTE — Assessment & Plan Note (Signed)
Mild, d/w pt, for tylenol prn,  to f/u any worsening symptoms or concerns

## 2014-09-23 ENCOUNTER — Other Ambulatory Visit: Payer: Self-pay | Admitting: Internal Medicine

## 2014-10-06 ENCOUNTER — Encounter: Payer: Self-pay | Admitting: Family Medicine

## 2014-10-06 ENCOUNTER — Ambulatory Visit (INDEPENDENT_AMBULATORY_CARE_PROVIDER_SITE_OTHER): Payer: 59 | Admitting: Family Medicine

## 2014-10-06 VITALS — BP 124/62 | HR 80 | Ht 68.0 in | Wt 167.0 lb

## 2014-10-06 DIAGNOSIS — M9904 Segmental and somatic dysfunction of sacral region: Secondary | ICD-10-CM | POA: Diagnosis not present

## 2014-10-06 DIAGNOSIS — M533 Sacrococcygeal disorders, not elsewhere classified: Secondary | ICD-10-CM

## 2014-10-06 DIAGNOSIS — M9902 Segmental and somatic dysfunction of thoracic region: Secondary | ICD-10-CM

## 2014-10-06 DIAGNOSIS — M999 Biomechanical lesion, unspecified: Secondary | ICD-10-CM | POA: Insufficient documentation

## 2014-10-06 DIAGNOSIS — M9903 Segmental and somatic dysfunction of lumbar region: Secondary | ICD-10-CM | POA: Diagnosis not present

## 2014-10-06 NOTE — Progress Notes (Signed)
Pre visit review using our clinic review tool, if applicable. No additional management support is needed unless otherwise documented below in the visit note. 

## 2014-10-06 NOTE — Assessment & Plan Note (Signed)
Decision today to treat with OMT was based on Physical Exam  After verbal consent patient was treated with HVLA, ME techniques in cervical, thoracic lumbar areas  Patient tolerated the procedure well with improvement in symptoms  Patient given exercises, stretches and lifestyle modifications  See medications in patient instructions if given  Patient will follow up in 2-3 weeks

## 2014-10-06 NOTE — Progress Notes (Signed)
[name] [place] Wayne [name], Wayne [name] Phone: [phone] Subjective:    I'm seeing this patient by the request  of:  Cathlean Cower, MD   CC: Lower back pain  [id number] Wayne [name] is a 54 y.o. male coming in with complaint of lower back pain. Patient states that he is had this pain for multiple years. Patient has seen a chiropractor previously. Patient was responding somewhat but continued to have difficulty of this left-sided lower back. Patient states it seemed to be localized with no radicular symptoms. Patient states that he is been working out more frequently and is having more difficulty after the exercises. Patient states it is more of a stiffness than true pain but can have a sharp pain with certain movements. Patient rates the severity of 6 out of 10 and is stopping him from doing certain lifting knowing that he'll pain have pain afterwards. Denies any nighttime awakening. Sometimes can be difficult to get comfortable at night.     Past Medical History  Diagnosis Date  . Migraines   . Hyperlipidemia   . Migraine, unspecified, without mention of intractable migraine without mention of status migrainosus 11/24/2013   Past Surgical History  Procedure Laterality Date  . Ruptured right achilles repair  02/22/2010   History  Substance Use Topics  . Smoking status: Never Smoker   . Smokeless tobacco: Never Used  . Alcohol Use: 1.8 oz/week    3 Cans of beer per week   Family History  Problem Relation Age of Onset  . Hyperlipidemia Mother   . Cancer Mother     lung cancer  . Hyperlipidemia Father   . Cancer Father   . Colon cancer Neg Hx   . Stomach cancer Neg Hx    No Known Allergies   Past medical history, social, surgical and family history all reviewed in electronic medical record.   Review of Systems: No headache, visual changes, nausea, vomiting, diarrhea, constipation, dizziness, abdominal pain, skin rash, fevers,  chills, night sweats, weight loss, swollen lymph nodes, body aches, joint swelling, muscle aches, chest pain, shortness of breath, mood changes.   Objective Blood pressure 124/62, pulse 80, height 5\' 8"  (1.727 m), weight 167 lb (75.751 kg), SpO2 97 %.  General: No apparent distress alert and oriented x3 mood and affect normal, dressed appropriately.  HEENT: Pupils equal, extraocular movements intact  Respiratory: Patient's speak in full sentences and does not appear short of breath  Cardiovascular: No lower extremity edema, non tender, no erythema  Skin: Warm dry intact with no signs of infection or rash on extremities or on axial skeleton.  Abdomen: Soft nontender  Neuro: Cranial nerves II through XII are intact, neurovascularly intact in all extremities with 2+ DTRs and 2+ pulses.  Lymph: No lymphadenopathy of posterior or anterior cervical chain or axillae bilaterally.  Gait normal with good balance and coordination.  MSK:  Non tender with full range of motion and good stability and symmetric strength and tone of shoulders, elbows, wrist, hip, knee and ankles bilaterally.  Back Exam:  Inspection: Unremarkable  Motion: Flexion 45 deg, Extension 25 deg, Side Bending to 45 deg bilaterally,  Rotation to 45 deg bilaterally  SLR laying: Negative  XSLR laying: Negative  Palpable tenderness: Severe tenderness over the left sacroiliac joint FABER: Positive left Sensory change: Gross sensation intact to all lumbar and sacral dermatomes.  Reflexes: 2+ at both patellar tendons, 2+ at achilles tendons, Babinski's downgoing.  Strength  at foot  Plantar-flexion: 5/5 Dorsi-flexion: 5/5 Eversion: 5/5 Inversion: 5/5  Leg strength  Quad: 5/5 Hamstring: 5/5 Hip flexor: 5/5 Hip abductors: 5/5  Gait unremarkable.  Osteopathic findings Thoracic T5 extended rotated and side bent right Lumbar L2 flexed rotated and side bent right Sacrum Right on right . Procedure note 64403; 15 minutes spent for  Therapeutic exercises as stated in above notes.  This included exercises focusing on stretching, strengthening, with significant focus on eccentric aspects.   Sacroiliac Joint Mobilization and Rehab 1. Work on pretzel stretching, shoulder back and leg draped in front. 3-5 sets, 30 sec.. 2. hip abductor rotations. standing, hip flexion and rotation outward then inward. 3 sets, 15 reps. when can do comfortably, add ankle weights starting at 2 pounds.  3. cross over stretching - shoulder back to ground, same side leg crossover. 3-5 sets for 30 min..  4. rolling up and back knees to chest and rocking. 5. sacral tilt - 5 sets, hold for 5-10 seconds  Proper technique shown and discussed handout in great detail with ATC.  All questions were discussed and answered.     Impression and Recommendations:     This case required medical decision making of moderate complexity.

## 2014-10-06 NOTE — Assessment & Plan Note (Signed)
Believe the patient has more of a sacroiliac joint dysfunction. We discussed different strengthening exercises especially for the hip abductors as well as the avoiding of back extension at this point. Patient did respond fairly well to osteopathic manipulation. We discussed icing regimen as well as over-the-counter natural supplementations. Patient is to try to make these different changes and come back and see me again in 3-4 weeks for further evaluation and treatment.

## 2014-10-06 NOTE — Patient Instructions (Signed)
Good to see you.  Ice 20 minutes 2 times daily. Usually after activity and before bed. Sacroiliac Joint Mobilization and Rehab 1. Work on pretzel stretching, shoulder back and leg draped in front. 3-5 sets, 30 sec.. 2. hip abductor rotations. standing, hip flexion and rotation outward then inward. 3 sets, 15 reps. when can do comfortably, add ankle weights starting at 2 pounds.  3. cross over stretching - shoulder back to ground, same side leg crossover. 3-5 sets for 30 min..  4. rolling up and back knees to chest and rocking. 5. sacral tilt - 5 sets, hold for 5-10 seconds Exercises on wall.  Heel and butt touching.  Raise leg 6 inches and hold 2 seconds.  Down slow for count of 4 seconds.  1 set of 30 reps daily on both sides.  pennsaid pinkie amount topically 2 times daily as needed.  Vitamin D 2000-5000 IU daily Fish oil 2 grams daily Consider turmeric 500mg  twice daily See me agaim in 2-3 weeks

## 2014-10-27 ENCOUNTER — Ambulatory Visit (INDEPENDENT_AMBULATORY_CARE_PROVIDER_SITE_OTHER): Payer: 59 | Admitting: Family Medicine

## 2014-10-27 ENCOUNTER — Encounter: Payer: Self-pay | Admitting: Family Medicine

## 2014-10-27 VITALS — BP 98/64 | HR 89 | Ht 68.0 in | Wt 167.0 lb

## 2014-10-27 DIAGNOSIS — M9904 Segmental and somatic dysfunction of sacral region: Secondary | ICD-10-CM | POA: Diagnosis not present

## 2014-10-27 DIAGNOSIS — M9902 Segmental and somatic dysfunction of thoracic region: Secondary | ICD-10-CM | POA: Diagnosis not present

## 2014-10-27 DIAGNOSIS — M999 Biomechanical lesion, unspecified: Secondary | ICD-10-CM

## 2014-10-27 DIAGNOSIS — M533 Sacrococcygeal disorders, not elsewhere classified: Secondary | ICD-10-CM | POA: Diagnosis not present

## 2014-10-27 DIAGNOSIS — M9903 Segmental and somatic dysfunction of lumbar region: Secondary | ICD-10-CM

## 2014-10-27 DIAGNOSIS — M216X9 Other acquired deformities of unspecified foot: Secondary | ICD-10-CM | POA: Diagnosis not present

## 2014-10-27 NOTE — Progress Notes (Signed)
[facility] [place], [place] [address] Phone: [phone] Subjective:    CC: Lower back pain follow up  [id number] Wayne [name] is a 54 y.o. male coming in with complaint of lower back pain. Patient states that he is had this pain for multiple years. Patient was not have more of a sacroiliac joint dysfunction. Patient did respond fairly well to osteopathic manipulation. Patient states that overall he is doing relatively well. Still has the back pain mostly over the sacroiliac joint. Nothing that is radiating down his leg. Denies any numbness or tingling. Denies any discomfort that is stopping him from daily activities. Patient states that he is sleeping comfortably as well. Continues the natural vitamins supplementations.      Past Medical History  Diagnosis Date  . Migraines   . Hyperlipidemia   . Migraine, unspecified, without mention of intractable migraine without mention of status migrainosus 11/24/2013   Past Surgical History  Procedure Laterality Date  . Ruptured right achilles repair  02/22/2010   History  Substance Use Topics  . Smoking status: Never Smoker   . Smokeless tobacco: Never Used  . Alcohol Use: 1.8 oz/week    3 Cans of beer per week   Family History  Problem Relation Age of Onset  . Hyperlipidemia Mother   . Cancer Mother     lung cancer  . Hyperlipidemia Father   . Cancer Father   . Colon cancer Neg Hx   . Stomach cancer Neg Hx    No Known Allergies   Past medical history, social, surgical and family history all reviewed in electronic medical record.   Review of Systems: No headache, visual changes, nausea, vomiting, diarrhea, constipation, dizziness, abdominal pain, skin rash, fevers, chills, night sweats, weight loss, swollen lymph nodes, body aches, joint swelling, muscle aches, chest pain, shortness of breath, mood changes.   Objective Blood pressure 98/64, pulse 89, height 5\' 8"  (1.727 m), weight  167 lb (75.751 kg), SpO2 96 %.  General: No apparent distress alert and oriented x3 mood and affect normal, dressed appropriately.  HEENT: Pupils equal, extraocular movements intact  Respiratory: Patient's speak in full sentences and does not appear short of breath  Cardiovascular: No lower extremity edema, non tender, no erythema  Skin: Warm dry intact with no signs of infection or rash on extremities or on axial skeleton.  Abdomen: Soft nontender  Neuro: Cranial nerves II through XII are intact, neurovascularly intact in all extremities with 2+ DTRs and 2+ pulses.  Lymph: No lymphadenopathy of posterior or anterior cervical chain or axillae bilaterally.  Gait normal with good balance and coordination.  MSK:  Non tender with full range of motion and good stability and symmetric strength and tone of shoulders, elbows, wrist, hip, knee and ankles bilaterally.  Back Exam:  Inspection: Unremarkable  Motion: Flexion 45 deg, Extension 25 deg, Side Bending to 45 deg bilaterally,  Rotation to 45 deg bilaterally  SLR laying: Negative  XSLR laying: Negative  Palpable tenderness: Severe tenderness over the left sacroiliac joint FABER: Positive left Sensory change: Gross sensation intact to all lumbar and sacral dermatomes.  Reflexes: 2+ at both patellar tendons, 2+ at achilles tendons, Babinski's downgoing.  Strength at foot  Plantar-flexion: 5/5 Dorsi-flexion: 5/5 Eversion: 5/5 Inversion: 5/5  Leg strength  Quad: 5/5 Hamstring: 5/5 Hip flexor: 5/5 Hip abductors: 5/5  Gait unremarkable.  Osteopathic findings Thoracic T5 extended rotated and side bent right Lumbar L2 flexed rotated and side  bent right Sacrum Right on right .  Foot exam shows the patient does have breakdown of the transverse arch bilaterally.     Impression and Recommendations:     This case required medical decision making of moderate complexity.

## 2014-10-27 NOTE — Assessment & Plan Note (Signed)
Patient does have loss of transverse arch bilaterally. We discussed icing regimen and home exercises. We discussed the possibility of custom orthotics and patient will consider this. Patient call if he would like to do this sooner. Otherwise patient will come back and see me in 4 weeks. Due to patient's employment having a walk on concrete I do think that this could be beneficial for his back as well.

## 2014-10-27 NOTE — Progress Notes (Signed)
Pre visit review using our clinic review tool, if applicable. No additional management support is needed unless otherwise documented below in the visit note. 

## 2014-10-27 NOTE — Patient Instructions (Signed)
Good to see you Ice can help when you need it Stay active Consider the orthotics Hip abductors are key See me again in 4 weeks or call sooner if you want orthotics.

## 2014-10-27 NOTE — Assessment & Plan Note (Signed)
Patient overall is doing very well. Once again encourage him to do exercises on a more regular basis as well as hip abductor exercises. Discussed icing regimen and continuing the vitamin supplementation. Patient come back and see me again in 4 weeks for further evaluation and treatment.

## 2014-10-27 NOTE — Assessment & Plan Note (Signed)
Decision today to treat with OMT was based on Physical Exam  After verbal consent patient was treated with HVLA, ME techniques in cervical, thoracic lumbar areas  Patient tolerated the procedure well with improvement in symptoms  Patient given exercises, stretches and lifestyle modifications  See medications in patient instructions if given  Patient will follow up in 4 weeks

## 2014-11-24 ENCOUNTER — Encounter: Payer: Self-pay | Admitting: Family Medicine

## 2014-11-24 ENCOUNTER — Ambulatory Visit (INDEPENDENT_AMBULATORY_CARE_PROVIDER_SITE_OTHER): Payer: 59 | Admitting: Family Medicine

## 2014-11-24 VITALS — BP 110/70 | HR 72 | Ht 68.0 in | Wt 166.0 lb

## 2014-11-24 DIAGNOSIS — M9904 Segmental and somatic dysfunction of sacral region: Secondary | ICD-10-CM

## 2014-11-24 DIAGNOSIS — M9902 Segmental and somatic dysfunction of thoracic region: Secondary | ICD-10-CM | POA: Diagnosis not present

## 2014-11-24 DIAGNOSIS — M216X9 Other acquired deformities of unspecified foot: Secondary | ICD-10-CM | POA: Diagnosis not present

## 2014-11-24 DIAGNOSIS — M533 Sacrococcygeal disorders, not elsewhere classified: Secondary | ICD-10-CM

## 2014-11-24 DIAGNOSIS — M9903 Segmental and somatic dysfunction of lumbar region: Secondary | ICD-10-CM

## 2014-11-24 DIAGNOSIS — M999 Biomechanical lesion, unspecified: Secondary | ICD-10-CM

## 2014-11-24 MED ORDER — CYCLOBENZAPRINE HCL 10 MG PO TABS: 10.0000 mg | ORAL_TABLET | Freq: Three times a day (TID) | ORAL | Status: AC | PRN

## 2014-11-24 NOTE — Progress Notes (Signed)
  [name] [name], Wayne [name] [address] Phone: [phone] Subjective:    CC: Lower back pain follow up  [name] is a 54 y.o. male coming in with complaint of lower back pain.  Patient has been seen recently by me. Patient has been responding very well to osteopathic manipulation as well as home exercises. Patient states       Past Medical History  Diagnosis Date  . Migraines   . Hyperlipidemia   . Migraine, unspecified, without mention of intractable migraine without mention of status migrainosus 11/24/2013   Past Surgical History  Procedure Laterality Date  . Ruptured right achilles repair  02/22/2010   History  Substance Use Topics  . Smoking status: Never Smoker   . Smokeless tobacco: Never Used  . Alcohol Use: 1.8 oz/week    3 Cans of beer per week   Family History  Problem Relation Age of Onset  . Hyperlipidemia Mother   . Cancer Mother     lung cancer  . Hyperlipidemia Father   . Cancer Father   . Colon cancer Neg Hx   . Stomach cancer Neg Hx    No Known Allergies   Past medical history, social, surgical and family history all reviewed in electronic medical record.   Review of Systems: No headache, visual changes, nausea, vomiting, diarrhea, constipation, dizziness, abdominal pain, skin rash, fevers, chills, night sweats, weight loss, swollen lymph nodes, body aches, joint swelling, muscle aches, chest pain, shortness of breath, mood changes.   Objective There were no vitals taken for this visit.  General: No apparent distress alert and oriented x3 mood and affect normal, dressed appropriately.  HEENT: Pupils equal, extraocular movements intact  Respiratory: Patient's speak in full sentences and does not appear short of breath  Cardiovascular: No lower extremity edema, non tender, no erythema  Skin: Warm dry intact with no signs of infection or rash on extremities or on axial skeleton.  Abdomen:  Soft nontender  Neuro: Cranial nerves II through XII are intact, neurovascularly intact in all extremities with 2+ DTRs and 2+ pulses.  Lymph: No lymphadenopathy of posterior or anterior cervical chain or axillae bilaterally.  Gait normal with good balance and coordination.  MSK:  Non tender with full range of motion and good stability and symmetric strength and tone of shoulders, elbows, wrist, hip, knee and ankles bilaterally.  Back Exam:  Inspection: Unremarkable  Motion: Flexion 45 deg, Extension 25 deg, Side Bending to 45 deg bilaterally,  Rotation to 45 deg bilaterally  SLR laying: Negative  XSLR laying: Negative  Palpable tenderness: Severe tenderness over the left sacroiliac joint FABER: Positive left Sensory change: Gross sensation intact to all lumbar and sacral dermatomes.  Reflexes: 2+ at both patellar tendons, 2+ at achilles tendons, Babinski's downgoing.  Strength at foot  Plantar-flexion: 5/5 Dorsi-flexion: 5/5 Eversion: 5/5 Inversion: 5/5  Leg strength  Quad: 5/5 Hamstring: 5/5 Hip flexor: 5/5 Hip abductors: 5/5  Gait unremarkable.  Osteopathic findings Thoracic T5 extended rotated and side bent right Lumbar L2 flexed rotated and side bent right Sacrum Right on right .  Foot exam shows the patient does have breakdown of the transverse arch bilaterally.     Impression and Recommendations:     This case required medical decision making of moderate complexity.

## 2014-11-24 NOTE — Assessment & Plan Note (Signed)
Noted to patient's alignment issues I do think that this could be contribute to some of his back pain. Patient will be set up for custom orthotics in the near future.

## 2014-11-24 NOTE — Assessment & Plan Note (Signed)
Patient is doing better at this time.   we discussed more core exercises and hip abductor exercises. Patient will try to do this on a more regular basis. Patient was given self-mutilation techniques that I think will be beneficial. Patient come back and see me again in 4 weeks for further evaluation or treatment.

## 2014-11-24 NOTE — Assessment & Plan Note (Signed)
Decision today to treat with OMT was based on Physical Exam  After verbal consent patient was treated with HVLA, ME techniques in cervical, thoracic lumbar areas  Patient tolerated the procedure well with improvement in symptoms  Patient given exercises, stretches and lifestyle modifications  See medications in patient instructions if given  Patient will follow up in 4 weeks

## 2014-11-24 NOTE — Progress Notes (Signed)
Pre visit review using our clinic review tool, if applicable. No additional management support is needed unless otherwise documented below in the visit note. 

## 2014-11-24 NOTE — Patient Instructions (Addendum)
Good to see you Ice is your friend when needed Keep working on the core strengthening Ask for lindsay if trouble scheduling.  Flexeril at night See me again in 4 weeks.

## 2014-12-07 ENCOUNTER — Ambulatory Visit: Payer: 59 | Admitting: Family Medicine

## 2014-12-10 ENCOUNTER — Ambulatory Visit (INDEPENDENT_AMBULATORY_CARE_PROVIDER_SITE_OTHER): Payer: 59 | Admitting: Family Medicine

## 2014-12-10 ENCOUNTER — Encounter: Payer: Self-pay | Admitting: Family Medicine

## 2014-12-10 DIAGNOSIS — M216X9 Other acquired deformities of unspecified foot: Secondary | ICD-10-CM | POA: Diagnosis not present

## 2014-12-10 NOTE — Progress Notes (Signed)
Patient was fitted for a : standard, cushioned, semi-rigid orthotic. The orthotic was heated and afterward the patient was in a seated position and the orthotic molded. The patient was positioned in subtalar neutral position and 10 degrees of ankle dorsiflexion in a non-weight bearing stance. After completion of molding, patient did have orthotic management which included instructions on acclimating to the orthotics, signs of ill fit as well as care for the orthotic.  I spent approximately 63minutes with the patient fitting the custom orthotics, making appropriate adjustments and discussing fit between shoes  The blank was ground to a stable position for weight bearing. Size: 9.5 (Igli Comfort)  Base: Carbon fiber Additional Posting and Padding: The following postings were fitted onto the molded orthotics to help maintain a talar neutral position - Wedge posting for transverse arch:  250/35 bilaterally      Silicone posting for longitudinal arch:  250/70 bilaterally (ground down)  The patient ambulated these, and they were very comfortable and supportive.

## 2014-12-10 NOTE — Patient Instructions (Signed)
Acclimating to your Igli orthotics -  ° °We recommend that you allow up to 2 weeks for you to fully acclimate to your new custom orthotics. Please use the following recommended plan to build into full day wear.  ° °Day 1 - 2hours/day °Every day afterwards add 1 hour of wear(3hrs/day, 4hrs/day, etc) until you are able to wear them for an entire day without issues.  ° °If you notice any irritation or increasing discomfort with your new orthotics, please do not hesitate in contacting the office(leave a message for Trevonn Hallum or [name]) or sending Deneene Tarver a message through MyChart to arrange a time to review your fit.  ° °Enjoy your new orthotics!!  ° °Wayne [name] ° ° ° °

## 2014-12-10 NOTE — Assessment & Plan Note (Signed)
Patient is doing well. Patient is on a start wearing these very slowly over the course the next 2-4 weeks. Patient and will come back and be evaluated in 2-4 weeks for further evaluation and treatment.

## 2014-12-17 ENCOUNTER — Other Ambulatory Visit: Payer: Self-pay | Admitting: Internal Medicine

## 2014-12-22 ENCOUNTER — Encounter: Payer: Self-pay | Admitting: Family Medicine

## 2014-12-22 ENCOUNTER — Other Ambulatory Visit: Payer: Self-pay

## 2014-12-22 ENCOUNTER — Ambulatory Visit (INDEPENDENT_AMBULATORY_CARE_PROVIDER_SITE_OTHER): Payer: 59 | Admitting: Family Medicine

## 2014-12-22 VITALS — BP 122/82 | HR 82 | Ht 68.0 in | Wt 169.0 lb

## 2014-12-22 DIAGNOSIS — M9904 Segmental and somatic dysfunction of sacral region: Secondary | ICD-10-CM

## 2014-12-22 DIAGNOSIS — M9903 Segmental and somatic dysfunction of lumbar region: Secondary | ICD-10-CM | POA: Diagnosis not present

## 2014-12-22 DIAGNOSIS — M9902 Segmental and somatic dysfunction of thoracic region: Secondary | ICD-10-CM | POA: Diagnosis not present

## 2014-12-22 DIAGNOSIS — M533 Sacrococcygeal disorders, not elsewhere classified: Secondary | ICD-10-CM

## 2014-12-22 DIAGNOSIS — M999 Biomechanical lesion, unspecified: Secondary | ICD-10-CM

## 2014-12-22 MED ORDER — SUMATRIPTAN SUCCINATE 100 MG PO TABS: 100.0000 mg | ORAL_TABLET | ORAL | Status: DC | PRN

## 2014-12-22 NOTE — Assessment & Plan Note (Signed)
Patient is doing significantly better at this time. We discussed icing regimen and home exercises. We discussed which activities to do and which ones to potentially avoid. We discussed proper lifting technique for overhead activities. We discussed his orthotics and potentially getting another pair in the near future. Patient and will come back and see me again in 5 weeks for further evaluation and treatment.

## 2014-12-22 NOTE — Progress Notes (Signed)
[facility] [address] [place], [place] [address] Phone: [phone] Subjective:    CC: Lower back pain follow up  [id number] Wayne [name] is a 54 y.o. male coming in with complaint of lower back pain.   Patient has been responding very well to osteopathic manipulation as well as home exercises. Patient states overall he is been relatively well. Patient is wearing new custom orthotics which has been very beneficial. Patient also has been starting to work out a more regular basis. Patient states that he continues to improve slowly. Does have a history of migraines and has had a migraine for quite some time.      Past Medical History  Diagnosis Date  . Migraines   . Hyperlipidemia   . Migraine, unspecified, without mention of intractable migraine without mention of status migrainosus 11/24/2013   Past Surgical History  Procedure Laterality Date  . Ruptured right achilles repair  02/22/2010   History  Substance Use Topics  . Smoking status: Never Smoker   . Smokeless tobacco: Never Used  . Alcohol Use: 1.8 oz/week    3 Cans of beer per week   Family History  Problem Relation Age of Onset  . Hyperlipidemia Mother   . Cancer Mother     lung cancer  . Hyperlipidemia Father   . Cancer Father   . Colon cancer Neg Hx   . Stomach cancer Neg Hx    No Known Allergies   Past medical history, social, surgical and family history all reviewed in electronic medical record.   Review of Systems: No headache, visual changes, nausea, vomiting, diarrhea, constipation, dizziness, abdominal pain, skin rash, fevers, chills, night sweats, weight loss, swollen lymph nodes, body aches, joint swelling, muscle aches, chest pain, shortness of breath, mood changes.   Objective Blood pressure 122/82, pulse 82, height 5\' 8"  (1.727 m), weight 169 lb (76.658 kg), SpO2 97 %.  General: No apparent distress alert and oriented x3 mood and affect normal, dressed  appropriately.  HEENT: Pupils equal, extraocular movements intact  Respiratory: Patient's speak in full sentences and does not appear short of breath  Cardiovascular: No lower extremity edema, non tender, no erythema  Skin: Warm dry intact with no signs of infection or rash on extremities or on axial skeleton.  Abdomen: Soft nontender  Neuro: Cranial nerves II through XII are intact, neurovascularly intact in all extremities with 2+ DTRs and 2+ pulses.  Lymph: No lymphadenopathy of posterior or anterior cervical chain or axillae bilaterally.  Gait normal with good balance and coordination.  MSK:  Non tender with full range of motion and good stability and symmetric strength and tone of shoulders, elbows, wrist, hip, knee and ankles bilaterally.  Back Exam:  Inspection: Unremarkable  Motion: Flexion 45 deg, Extension 25 deg, Side Bending to 45 deg bilaterally,  Rotation to 45 deg bilaterally  SLR laying: Negative  XSLR laying: Negative  Palpable tenderness: Only minimal tenderness over the left sacroiliac joint FABER: Positive left still present Sensory change: Gross sensation intact to all lumbar and sacral dermatomes.  Reflexes: 2+ at both patellar tendons, 2+ at achilles tendons, Babinski's downgoing.  Strength at foot  Plantar-flexion: 5/5 Dorsi-flexion: 5/5 Eversion: 5/5 Inversion: 5/5  Leg strength  Quad: 5/5 Hamstring: 5/5 Hip flexor: 5/5 Hip abductors: 5/5  Gait unremarkable.  Osteopathic findings Cervical C2 flexed rotated and side bent right C3 flexed rotated and side bent left C4 flexed rotated and side bent right  Thoracic T5  extended rotated and side bent right Lumbar L2 flexed rotated and side bent right Sacrum Right on right      Impression and Recommendations:     This case required medical decision making of moderate complexity.

## 2014-12-22 NOTE — Patient Instructions (Signed)
Good to see you Ice is your friend Continue the exercises you are doing great Leveda Anna will adjust a little Lets not change anything Push you to 5 weeks.

## 2014-12-22 NOTE — Assessment & Plan Note (Signed)
Decision today to treat with OMT was based on Physical Exam  After verbal consent patient was treated with HVLA, ME techniques in cervical, thoracic lumbar areas  Patient tolerated the procedure well with improvement in symptoms  Patient given exercises, stretches and lifestyle modifications  See medications in patient instructions if given  Patient will follow up in 5 weeks

## 2014-12-22 NOTE — Progress Notes (Signed)
Pre visit review using our clinic review tool, if applicable. No additional management support is needed unless otherwise documented below in the visit note. 

## 2014-12-28 ENCOUNTER — Ambulatory Visit (INDEPENDENT_AMBULATORY_CARE_PROVIDER_SITE_OTHER): Payer: 59 | Admitting: Family Medicine

## 2014-12-28 DIAGNOSIS — M216X9 Other acquired deformities of unspecified foot: Secondary | ICD-10-CM

## 2014-12-28 NOTE — Progress Notes (Signed)
Patient was fitted for a : standard, cushioned, semi-rigid orthotic. The orthotic was heated and afterward the patient was in a seated position and the orthotic molded. The patient was positioned in subtalar neutral position and 10 degrees of ankle dorsiflexion in a non-weight bearing stance. After completion of molding, patient did have orthotic management which included instructions on acclimating to the orthotics, signs of ill fit as well as care for the orthotic.  I spent approximately 54minutes with the patient discussing proper fit, switching between footwear, and proper care of the orthotics.   The blank was ground to a stable position for weight bearing. Size: 10 Base: Carbon fiber Additional Posting and Padding: The following postings were fitted onto the molded orthotics to help maintain a talar neutral position - Wedge posting for transverse arch:  315/17    Silicone posting for longitudinal arch:  250/100 The patient ambulated these, and they were very comfortable and supportive.

## 2014-12-28 NOTE — Patient Instructions (Signed)
Acclimating to your [facility] orthotics -  ° °We recommend that you allow up to 2 weeks for you to fully acclimate to your new custom orthotics. Please use the following recommended plan to build into full day wear.  ° °Day 1 - 2hours/day °Every day afterwards add 1 hour of wear(3hrs/day, 4hrs/day, etc) until you are able to wear them for an entire day without issues.  ° °If you notice any irritation or increasing discomfort with your new orthotics, please do not hesitate in contacting the office(leave a message for [name] or [name]) or sending [name] a message through MyChart to arrange a time to review your fit.  ° °Enjoy your new orthotics!!  ° °Wayne [name] ° ° ° °

## 2014-12-28 NOTE — Assessment & Plan Note (Signed)
Placed in custom orthotics today. Patient's second pair. Patient will come back and see me again in 4 weeks for further evaluation.

## 2015-02-09 ENCOUNTER — Ambulatory Visit: Payer: 59 | Admitting: Internal Medicine

## 2015-02-09 ENCOUNTER — Encounter: Payer: Self-pay | Admitting: Family Medicine

## 2015-02-09 ENCOUNTER — Ambulatory Visit (INDEPENDENT_AMBULATORY_CARE_PROVIDER_SITE_OTHER): Payer: 59 | Admitting: Family Medicine

## 2015-02-09 VITALS — BP 112/68 | HR 72 | Ht 68.0 in | Wt 167.0 lb

## 2015-02-09 DIAGNOSIS — M533 Sacrococcygeal disorders, not elsewhere classified: Secondary | ICD-10-CM | POA: Diagnosis not present

## 2015-02-09 DIAGNOSIS — M9902 Segmental and somatic dysfunction of thoracic region: Secondary | ICD-10-CM

## 2015-02-09 DIAGNOSIS — M9904 Segmental and somatic dysfunction of sacral region: Secondary | ICD-10-CM | POA: Diagnosis not present

## 2015-02-09 DIAGNOSIS — M999 Biomechanical lesion, unspecified: Secondary | ICD-10-CM

## 2015-02-09 DIAGNOSIS — M9903 Segmental and somatic dysfunction of lumbar region: Secondary | ICD-10-CM | POA: Diagnosis not present

## 2015-02-09 NOTE — Assessment & Plan Note (Signed)
Patient overall is doing relatively well. Patient did have more tightness after his recent travel. Encourage him to get back on the routine. We discussed icing regimen and home exercises. We discussed range of motion and possibly making some ergonomic changes in the car. Patient was gently changes and come back and see me again in 3-4 weeks for further evaluation and treatment.

## 2015-02-09 NOTE — Progress Notes (Signed)
[facility] [address] [place], [place] [address] Phone: [phone] Subjective:    CC: Lower back pain follow up  [id number] Wayne [name] is a 54 y.o. male coming in with complaint of lower back pain.   Patient has been responding very well to osteopathic manipulation as well as home exercises. Patient did go out of the country and did have long flights. Patient also is driving a significant amount. Having more discomfort and pain. Discussed with him that only seems to be the lower back. Not as much of the upper back. Was wearing the orthotics her regular basis..       Past Medical History  Diagnosis Date  . Migraines   . Hyperlipidemia   . Migraine, unspecified, without mention of intractable migraine without mention of status migrainosus 11/24/2013   Past Surgical History  Procedure Laterality Date  . Ruptured right achilles repair  02/22/2010   Social History  Substance Use Topics  . Smoking status: Never Smoker   . Smokeless tobacco: Never Used  . Alcohol Use: 1.8 oz/week    3 Cans of beer per week   Family History  Problem Relation Age of Onset  . Hyperlipidemia Mother   . Cancer Mother     lung cancer  . Hyperlipidemia Father   . Cancer Father   . Colon cancer Neg Hx   . Stomach cancer Neg Hx    No Known Allergies   Past medical history, social, surgical and family history all reviewed in electronic medical record.   Review of Systems: No headache, visual changes, nausea, vomiting, diarrhea, constipation, dizziness, abdominal pain, skin rash, fevers, chills, night sweats, weight loss, swollen lymph nodes, body aches, joint swelling, muscle aches, chest pain, shortness of breath, mood changes.   Objective There were no vitals taken for this visit.  General: No apparent distress alert and oriented x3 mood and affect normal, dressed appropriately.  HEENT: Pupils equal, extraocular movements intact  Respiratory: Patient's speak in  full sentences and does not appear short of breath  Cardiovascular: No lower extremity edema, non tender, no erythema  Skin: Warm dry intact with no signs of infection or rash on extremities or on axial skeleton.  Abdomen: Soft nontender  Neuro: Cranial nerves II through XII are intact, neurovascularly intact in all extremities with 2+ DTRs and 2+ pulses.  Lymph: No lymphadenopathy of posterior or anterior cervical chain or axillae bilaterally.  Gait normal with good balance and coordination.  MSK:  Non tender with full range of motion and good stability and symmetric strength and tone of shoulders, elbows, wrist, hip, knee and ankles bilaterally.  Back Exam:  Inspection: Unremarkable  Motion: Flexion 45 deg, Extension 25 deg, Side Bending to 45 deg bilaterally,  Rotation to 45 deg bilaterally  SLR laying: Negative  XSLR laying: Negative  Palpable tenderness: Mild increase in tightness of the paraspinal musculature of the lumbar spine  FABER: Positive left still present Sensory change: Gross sensation intact to all lumbar and sacral dermatomes.  Reflexes: 2+ at both patellar tendons, 2+ at achilles tendons, Babinski's downgoing.  Strength at foot  Plantar-flexion: 5/5 Dorsi-flexion: 5/5 Eversion: 5/5 Inversion: 5/5  Leg strength  Quad: 5/5 Hamstring: 5/5 Hip flexor: 5/5 Hip abductors: 4/5  Gait unremarkable.  Osteopathic findings Cervical C2 flexed rotated and side bent right C3 flexed rotated and side bent left C4 flexed rotated and side bent right  Thoracic T5 extended rotated and side bent right T7 extended  rotated and side bent left Lumbar L2 flexed rotated and side bent right Sacrum Right on right      Impression and Recommendations:     This case required medical decision making of moderate complexity.

## 2015-02-09 NOTE — Progress Notes (Signed)
Pre visit review using our clinic review tool, if applicable. No additional management support is needed unless otherwise documented below in the visit note. 

## 2015-02-09 NOTE — Assessment & Plan Note (Signed)
Decision today to treat with OMT was based on Physical Exam  After verbal consent patient was treated with HVLA, ME techniques in cervical, thoracic lumbar areas  Patient tolerated the procedure well with improvement in symptoms  Patient given exercises, stretches and lifestyle modifications  See medications in patient instructions if given  Patient will follow up in 3-4 weeks

## 2015-02-09 NOTE — Patient Instructions (Signed)
Good to see you Get back the horse No real changes Ice if you need it.  See me again in 3-4 weeks.

## 2015-03-02 ENCOUNTER — Encounter: Payer: Self-pay | Admitting: Family Medicine

## 2015-03-02 ENCOUNTER — Ambulatory Visit (INDEPENDENT_AMBULATORY_CARE_PROVIDER_SITE_OTHER): Payer: 59 | Admitting: Family Medicine

## 2015-03-02 VITALS — BP 120/84 | HR 66 | Ht 68.0 in | Wt 166.0 lb

## 2015-03-02 DIAGNOSIS — M999 Biomechanical lesion, unspecified: Secondary | ICD-10-CM

## 2015-03-02 DIAGNOSIS — M533 Sacrococcygeal disorders, not elsewhere classified: Secondary | ICD-10-CM | POA: Diagnosis not present

## 2015-03-02 DIAGNOSIS — M9902 Segmental and somatic dysfunction of thoracic region: Secondary | ICD-10-CM | POA: Diagnosis not present

## 2015-03-02 DIAGNOSIS — M9903 Segmental and somatic dysfunction of lumbar region: Secondary | ICD-10-CM

## 2015-03-02 DIAGNOSIS — M9904 Segmental and somatic dysfunction of sacral region: Secondary | ICD-10-CM

## 2015-03-02 NOTE — Patient Instructions (Addendum)
Great to see you Consider tart cherry extract Ice is your friend Keep wearing the orthotics Keep doing what you are doing.  See me again in 4-6 weeks.

## 2015-03-02 NOTE — Progress Notes (Signed)
Pre visit review using our clinic review tool, if applicable. No additional management support is needed unless otherwise documented below in the visit note. 

## 2015-03-02 NOTE — Progress Notes (Signed)
[facility] [address], [place] [address] Phone: [phone] Subjective:    CC: Lower back pain follow up  [id number] Wayne [name] is a 54 y.o. male coming in with complaint of lower back pain.   Patient has been responding very well to osteopathic manipulation as well as home exercises. Patient has been doing more of his regular routine. Still some minimal discomfort of the lower back but nothing that seems to be stopping him. Has been working out with a friend and has done some more stretching that seems to be more beneficial. Denies any pain that seems to be stopping him from activity. Overall continues to improve very slowly.      Past Medical History  Diagnosis Date  . Migraines   . Hyperlipidemia   . Migraine, unspecified, without mention of intractable migraine without mention of status migrainosus 11/24/2013   Past Surgical History  Procedure Laterality Date  . Ruptured right achilles repair  02/22/2010   Social History  Substance Use Topics  . Smoking status: Never Smoker   . Smokeless tobacco: Never Used  . Alcohol Use: 1.8 oz/week    3 Cans of beer per week   Family History  Problem Relation Age of Onset  . Hyperlipidemia Mother   . Cancer Mother     lung cancer  . Hyperlipidemia Father   . Cancer Father   . Colon cancer Neg Hx   . Stomach cancer Neg Hx    No Known Allergies   Past medical history, social, surgical and family history all reviewed in electronic medical record.   Review of Systems: No headache, visual changes, nausea, vomiting, diarrhea, constipation, dizziness, abdominal pain, skin rash, fevers, chills, night sweats, weight loss, swollen lymph nodes, body aches, joint swelling, muscle aches, chest pain, shortness of breath, mood changes.   Objective Blood pressure 120/84, pulse 66, height 5\' 8"  (1.727 m), weight 166 lb (75.297 kg), SpO2 97 %.  General: No apparent distress alert and oriented x3 mood  and affect normal, dressed appropriately.  HEENT: Pupils equal, extraocular movements intact  Respiratory: Patient's speak in full sentences and does not appear short of breath  Cardiovascular: No lower extremity edema, non tender, no erythema  Skin: Warm dry intact with no signs of infection or rash on extremities or on axial skeleton.  Abdomen: Soft nontender  Neuro: Cranial nerves II through XII are intact, neurovascularly intact in all extremities with 2+ DTRs and 2+ pulses.  Lymph: No lymphadenopathy of posterior or anterior cervical chain or axillae bilaterally.  Gait normal with good balance and coordination.  MSK:  Non tender with full range of motion and good stability and symmetric strength and tone of shoulders, elbows, wrist, hip, knee and ankles bilaterally.  Back Exam:  Inspection: Unremarkable  Motion: Flexion 45 deg, Extension 25 deg, Side Bending to 45 deg bilaterally,  Rotation to 45 deg bilaterally  SLR laying: Negative  XSLR laying: Negative  Palpable tenderness: Mild increase in tightness of the paraspinal musculature of the lumbar spine  FABER: Positive left still present Sensory change: Gross sensation intact to all lumbar and sacral dermatomes.  Reflexes: 2+ at both patellar tendons, 2+ at achilles tendons, Babinski's downgoing.  Strength at foot  Plantar-flexion: 5/5 Dorsi-flexion: 5/5 Eversion: 5/5 Inversion: 5/5  Leg strength  Quad: 5/5 Hamstring: 5/5 Hip flexor: 5/5 Hip abductors: 4/5  Gait unremarkable.  Osteopathic findings Cervical C2 flexed rotated and side bent right C3 flexed rotated and  side bent left C4 flexed rotated and side bent right  Thoracic T5 extended rotated and side bent right T7 extended rotated and side bent left Lumbar L2 flexed rotated and side bent right Sacrum Right on right      Impression and Recommendations:     This case required medical decision making of moderate complexity.

## 2015-03-02 NOTE — Assessment & Plan Note (Signed)
Decision today to treat with OMT was based on Physical Exam  After verbal consent patient was treated with HVLA, ME techniques in cervical, thoracic lumbar areas  Patient tolerated the procedure well with improvement in symptoms  Patient given exercises, stretches and lifestyle modifications  See medications in patient instructions if given  Patient will follow up in 4-6 weeks

## 2015-03-02 NOTE — Assessment & Plan Note (Signed)
Patient is doing very well overall. Encourage him to continue the same exercises and the icing regimen. Patient will continue the same natural supplementations. Patient and will come back and see me again in 4-6 weeks.

## 2015-03-30 ENCOUNTER — Ambulatory Visit (INDEPENDENT_AMBULATORY_CARE_PROVIDER_SITE_OTHER): Payer: 59 | Admitting: Family Medicine

## 2015-03-30 ENCOUNTER — Encounter: Payer: Self-pay | Admitting: Family Medicine

## 2015-03-30 VITALS — BP 128/70 | HR 66 | Ht 68.0 in | Wt 166.0 lb

## 2015-03-30 DIAGNOSIS — M533 Sacrococcygeal disorders, not elsewhere classified: Secondary | ICD-10-CM

## 2015-03-30 DIAGNOSIS — M999 Biomechanical lesion, unspecified: Secondary | ICD-10-CM

## 2015-03-30 DIAGNOSIS — M9904 Segmental and somatic dysfunction of sacral region: Secondary | ICD-10-CM

## 2015-03-30 DIAGNOSIS — M9902 Segmental and somatic dysfunction of thoracic region: Secondary | ICD-10-CM | POA: Diagnosis not present

## 2015-03-30 DIAGNOSIS — M7662 Achilles tendinitis, left leg: Secondary | ICD-10-CM | POA: Insufficient documentation

## 2015-03-30 DIAGNOSIS — M9903 Segmental and somatic dysfunction of lumbar region: Secondary | ICD-10-CM | POA: Diagnosis not present

## 2015-03-30 NOTE — Assessment & Plan Note (Signed)
Patient is doing relatively well at this time. Encourage him to continue to work on core strength and the range of motion. Patient is having less and less exacerbations at this time. Has not needed any of his medications such as the muscle relaxer or even anti-inflammatories. Patient come back and see me again in 4 weeks.

## 2015-03-30 NOTE — Patient Instructions (Signed)
Great to see you Sorry I stood you up Continue to do what you are doing.  Have fun in [place] if you have to go! For the achilles see jodi and do stretches Heel lift from [facility] or something.  Use the pennsaid 2 times daily  See me again in 4 weeks.

## 2015-03-30 NOTE — Progress Notes (Signed)
Pre visit review using our clinic review tool, if applicable. No additional management support is needed unless otherwise documented below in the visit note. 

## 2015-03-30 NOTE — Assessment & Plan Note (Signed)
Patient does have a tendinitis of the left ankle. I do think patient will do well with conservative therapy. Patient work with Product/process development scientist to learn home exercises. Patient and will come back and see me again in 4 weeks. No bracing at this time the patient will do topical anti-inflammatories.

## 2015-03-30 NOTE — Assessment & Plan Note (Signed)
Decision today to treat with OMT was based on Physical Exam  After verbal consent patient was treated with HVLA, ME techniques in cervical, thoracic lumbar areas  Patient tolerated the procedure well with improvement in symptoms  Patient given exercises, stretches and lifestyle modifications  See medications in patient instructions if given  Patient will follow up in 4-6 weeks

## 2015-03-30 NOTE — Progress Notes (Signed)
[name] [place], [place] [address] Phone: [phone] Subjective:    CC: Lower back pain follow up  [id number] Wayne [name] is a 54 y.o. male coming in with complaint of lower back pain.   Patient has been responding very well to osteopathic manipulation as well as home exercises. Patient has been doing more of his regular routine.  Patient was lifting more on a regular basis. Patient states overall he is doing relatively well. Some mild tightness today but up to that point was not having any significant pain. Mild stiffness in the ankle. Patient has had contralateral Achilles tendon injury notices some tightness on the left side. Patient does not want to have any injury so he has decrease is working out at this time. Denies any new symptoms in the back. States that she some tightness of the lower back at this time.      Past Medical History  Diagnosis Date  . Migraines   . Hyperlipidemia   . Migraine, unspecified, without mention of intractable migraine without mention of status migrainosus 11/24/2013   Past Surgical History  Procedure Laterality Date  . Ruptured right achilles repair  02/22/2010   Social History  Substance Use Topics  . Smoking status: Never Smoker   . Smokeless tobacco: Never Used  . Alcohol Use: 1.8 oz/week    3 Cans of beer per week   Family History  Problem Relation Age of Onset  . Hyperlipidemia Mother   . Cancer Mother     lung cancer  . Hyperlipidemia Father   . Cancer Father   . Colon cancer Neg Hx   . Stomach cancer Neg Hx    No Known Allergies   Past medical history, social, surgical and family history all reviewed in electronic medical record.   Review of Systems: No headache, visual changes, nausea, vomiting, diarrhea, constipation, dizziness, abdominal pain, skin rash, fevers, chills, night sweats, weight loss, swollen lymph nodes, body aches, joint swelling, muscle aches, chest pain,  shortness of breath, mood changes.   Objective Blood pressure 128/70, pulse 66, height 5\' 8"  (1.727 m), weight 166 lb (75.297 kg), SpO2 98 %.  General: No apparent distress alert and oriented x3 mood and affect normal, dressed appropriately.  HEENT: Pupils equal, extraocular movements intact  Respiratory: Patient's speak in full sentences and does not appear short of breath  Cardiovascular: No lower extremity edema, non tender, no erythema  Skin: Warm dry intact with no signs of infection or rash on extremities or on axial skeleton.  Abdomen: Soft nontender  Neuro: Cranial nerves II through XII are intact, neurovascularly intact in all extremities with 2+ DTRs and 2+ pulses.  Lymph: No lymphadenopathy of posterior or anterior cervical chain or axillae bilaterally.  Gait normal with good balance and coordination.  MSK:  Non tender with full range of motion and good stability and symmetric strength and tone of shoulders, elbows, wrist, hip, knees bilaterally.  Patient's right sided ankle does have postural changes. No significant findings. Patient's left ankle has full range of motion. Minimally tender to palpation 17 m above the insertion of the Achilles. No nodule presented. Full range of motion and neurovascular intact. Back Exam:  Inspection: Unremarkable  Motion: Flexion 45 deg, Extension 25 deg, Side Bending to 45 deg bilaterally,  Rotation to 45 deg bilaterally  SLR laying: Negative  XSLR laying: Negative  Palpable tenderness: Less tenderness and tightness of the spinal muscular trauma the lumbar spine  today. FABER: Positive left still present Sensory change: Gross sensation intact to all lumbar and sacral dermatomes.  Reflexes: 2+ at both patellar tendons, 2+ at achilles tendons, Babinski's downgoing.  Strength at foot  Plantar-flexion: 5/5 Dorsi-flexion: 5/5 Eversion: 5/5 Inversion: 5/5  Leg strength  Quad: 5/5 Hamstring: 5/5 Hip flexor: 5/5 Hip abductors: 4/5  Gait  unremarkable.  Osteopathic findings Cervical C2 flexed rotated and side bent right C4 flexed rotated and side bent right  Thoracic T3 extended rotated and side bent right T7 extended rotated and side bent left Lumbar L2 flexed rotated and side bent right Sacrum Right on right      Impression and Recommendations:     This case required medical decision making of moderate complexity.

## 2015-04-27 ENCOUNTER — Ambulatory Visit: Payer: 59 | Admitting: Family Medicine

## 2016-03-19 ENCOUNTER — Other Ambulatory Visit: Payer: Self-pay | Admitting: Internal Medicine

## 2016-11-05 ENCOUNTER — Encounter: Payer: Self-pay | Admitting: *Deleted

## 2017-03-13 ENCOUNTER — Encounter: Payer: Self-pay | Admitting: Internal Medicine

## 2017-04-30 DIAGNOSIS — Z125 Encounter for screening for malignant neoplasm of prostate: Secondary | ICD-10-CM | POA: Diagnosis not present

## 2017-04-30 DIAGNOSIS — Z79899 Other long term (current) drug therapy: Secondary | ICD-10-CM | POA: Diagnosis not present

## 2017-04-30 DIAGNOSIS — E7800 Pure hypercholesterolemia, unspecified: Secondary | ICD-10-CM | POA: Diagnosis not present

## 2017-04-30 DIAGNOSIS — E559 Vitamin D deficiency, unspecified: Secondary | ICD-10-CM | POA: Diagnosis not present

## 2017-05-25 ENCOUNTER — Other Ambulatory Visit: Payer: Self-pay | Admitting: Internal Medicine

## 2017-06-17 DIAGNOSIS — Z202 Contact with and (suspected) exposure to infections with a predominantly sexual mode of transmission: Secondary | ICD-10-CM | POA: Diagnosis not present

## 2017-09-05 ENCOUNTER — Encounter: Payer: Self-pay | Admitting: Internal Medicine

## 2017-10-17 ENCOUNTER — Other Ambulatory Visit: Payer: Self-pay

## 2017-10-17 ENCOUNTER — Ambulatory Visit (AMBULATORY_SURGERY_CENTER): Payer: Self-pay

## 2017-10-17 VITALS — Ht 68.0 in | Wt 166.0 lb

## 2017-10-17 DIAGNOSIS — Z86010 Personal history of colonic polyps: Secondary | ICD-10-CM

## 2017-10-17 MED ORDER — NA SULFATE-K SULFATE-MG SULF 17.5-3.13-1.6 GM/177ML PO SOLN: 1.0000 | Freq: Once | ORAL | 0 refills | Status: AC

## 2017-10-17 NOTE — Progress Notes (Signed)
No egg or soy allergy known to patient  No issues with past sedation with any surgeries  or procedures, no intubation problems  No diet pills per patient No home 02 use per patient  No blood thinners per patient  Pt denies issues with constipation  No A fib or A flutter  EMMI video sent to pt's e mail , declined  

## 2017-10-25 ENCOUNTER — Encounter: Payer: Self-pay | Admitting: Internal Medicine

## 2017-10-30 ENCOUNTER — Encounter: Payer: Self-pay | Admitting: Internal Medicine

## 2017-10-30 ENCOUNTER — Other Ambulatory Visit: Payer: Self-pay

## 2017-10-30 ENCOUNTER — Ambulatory Visit (AMBULATORY_SURGERY_CENTER): Payer: BLUE CROSS/BLUE SHIELD | Admitting: Internal Medicine

## 2017-10-30 VITALS — BP 101/60 | HR 78 | Temp 98.6°F | Resp 13 | Ht 68.0 in | Wt 166.0 lb

## 2017-10-30 DIAGNOSIS — D124 Benign neoplasm of descending colon: Secondary | ICD-10-CM | POA: Diagnosis not present

## 2017-10-30 DIAGNOSIS — Z86010 Personal history of colonic polyps: Secondary | ICD-10-CM

## 2017-10-30 DIAGNOSIS — K635 Polyp of colon: Secondary | ICD-10-CM

## 2017-10-30 MED ORDER — SODIUM CHLORIDE 0.9 % IV SOLN: 500.0000 mL | Freq: Once | INTRAVENOUS | Status: AC

## 2017-10-30 NOTE — Progress Notes (Signed)
Pt's states no medical or surgical changes since previsit or office visit. 

## 2017-10-30 NOTE — Progress Notes (Signed)
Report to PACU, RN, vss, BBS= Clear.  

## 2017-10-30 NOTE — Op Note (Signed)
[facility] Patient Name: Wayne [name] Procedure Date: 10/30/2017 1:38 PM MRN: [id number] Endoscopist: Jerene Bears , MD Age: 57 Referring MD:  Date of Birth: [birth year] Gender: Male Account #: [id number] Procedure:                Colonoscopy Indications:              Surveillance: Personal history of adenomatous                            polyps on last colonoscopy 5 years ago Medicines:                Monitored Anesthesia Care Procedure:                Pre-Anesthesia Assessment:                           - Prior to the procedure, a History and Physical                            was performed, and patient medications and                            allergies were reviewed. The patient's tolerance of                            previous anesthesia was also reviewed. The risks                            and benefits of the procedure and the sedation                            options and risks were discussed with the patient.                            All questions were answered, and informed consent                            was obtained. Prior Anticoagulants: The patient has                            taken no previous anticoagulant or antiplatelet                            agents. ASA Grade Assessment: II - A patient with                            mild systemic disease. After reviewing the risks                            and benefits, the patient was deemed in                            satisfactory condition to undergo the procedure.  After obtaining informed consent, the colonoscope                            was passed under direct vision. Throughout the                            procedure, the patient's blood pressure, pulse, and                            oxygen saturations were monitored continuously. The                            Colonoscope was introduced through the anus and                            advanced to the cecum, identified  by appendiceal                            orifice and ileocecal valve. The colonoscopy was                            performed without difficulty. The patient tolerated                            the procedure well. The quality of the bowel                            preparation was excellent. The ileocecal valve,                            appendiceal orifice, and rectum were photographed. Scope In: 1:43:12 PM Scope Out: 1:51:29 PM Scope Withdrawal Time: 0 hours 7 minutes 2 seconds  Total Procedure Duration: 0 hours 8 minutes 17 seconds  Findings:                 The digital rectal exam was normal.                           A 4 mm polyp was found in the descending colon. The                            polyp was sessile. The polyp was removed with a                            cold snare. Resection and retrieval were complete.                           Multiple small-mouthed diverticula were found in                            the sigmoid colon and descending colon.                           Internal hemorrhoids were found during  retroflexion. The hemorrhoids were medium-sized. Complications:            No immediate complications. Estimated Blood Loss:     Estimated blood loss was minimal. Impression:               - One 4 mm polyp in the descending colon, removed                            with a cold snare. Resected and retrieved.                           - Moderate diverticulosis in the sigmoid colon and                            in the descending colon.                           - Internal hemorrhoids. Recommendation:           - Patient has a contact number available for                            emergencies. The signs and symptoms of potential                            delayed complications were discussed with the                            patient. Return to normal activities tomorrow.                            Written discharge instructions were  provided to the                            patient.                           - Resume previous diet.                           - Continue present medications.                           - Await pathology results.                           - Repeat colonoscopy in 5 years for surveillance. Jerene Bears, MD 10/30/2017 1:55:05 PM This report has been signed electronically.

## 2017-10-30 NOTE — Progress Notes (Signed)
Called to room to assist during endoscopic procedure.  Patient ID and intended procedure confirmed with present staff. Received instructions for my participation in the procedure from the performing physician.  

## 2017-10-30 NOTE — Patient Instructions (Signed)
YOU HAD AN ENDOSCOPIC PROCEDURE TODAY AT THE [facility]:   Refer to the procedure report that was given to you for any specific questions about what was found during the examination.  If the procedure report does not answer your questions, please call your gastroenterologist to clarify.  If you requested that your care partner not be given the details of your procedure findings, then the procedure report has been included in a sealed envelope for you to review at your convenience later.  YOU SHOULD EXPECT: Some feelings of bloating in the abdomen. Passage of more gas than usual.  Walking can help get rid of the air that was put into your GI tract during the procedure and reduce the bloating. If you had a lower endoscopy (such as a colonoscopy or flexible sigmoidoscopy) you may notice spotting of blood in your stool or on the toilet paper. If you underwent a bowel prep for your procedure, you may not have a normal bowel movement for a few days.  Please Note:  You might notice some irritation and congestion in your nose or some drainage.  This is from the oxygen used during your procedure.  There is no need for concern and it should clear up in a day or so.  SYMPTOMS TO REPORT IMMEDIATELY:   Following lower endoscopy (colonoscopy or flexible sigmoidoscopy):  Excessive amounts of blood in the stool  Significant tenderness or worsening of abdominal pains  Swelling of the abdomen that is new, acute  Fever of 100F or higher   For urgent or emergent issues, a gastroenterologist can be reached at any hour by calling [phone].   DIET:  We do recommend a small meal at first, but then you may proceed to your regular diet.  Drink plenty of fluids but you should avoid alcoholic beverages for 24 hours.  ACTIVITY:  You should plan to take it easy for the rest of today and you should NOT DRIVE or use heavy machinery until tomorrow (because of the sedation medicines used during the test).     FOLLOW UP: Our staff will call the number listed on your records the next business day following your procedure to check on you and address any questions or concerns that you may have regarding the information given to you following your procedure. If we do not reach you, we will leave a message.  However, if you are feeling well and you are not experiencing any problems, there is no need to return our call.  We will assume that you have returned to your regular daily activities without incident.  If any biopsies were taken you will be contacted by phone or by letter within the next 1-3 weeks.  Please call us at [phone] if you have not heard about the biopsies in 3 weeks.    SIGNATURES/CONFIDENTIALITY: You and/or your care partner have signed paperwork which will be entered into your electronic medical record.  These signatures attest to the fact that that the information above on your After Visit Summary has been reviewed and is understood.  Full responsibility of the confidentiality of this discharge information lies with you and/or your care-partner.   Handouts were given to your care partner on polyps, diverticulosis, and hemorrhoids. You may resume your current medications today. Await biopsy results. Please call if any questions or concerns.   

## 2017-10-31 ENCOUNTER — Telehealth: Payer: Self-pay | Admitting: *Deleted

## 2017-10-31 ENCOUNTER — Telehealth: Payer: Self-pay

## 2017-10-31 NOTE — Telephone Encounter (Signed)
  Follow up Call-  Call back number 10/30/2017  Post procedure Call Back phone  # [phone]  Permission to leave phone message Yes  Some recent data might be hidden     Patient questions:  Do you have a fever, pain , or abdominal swelling? No. Pain Score  0 *  Have you tolerated food without any problems? Yes.    Have you been able to return to your normal activities? Yes.    Do you have any questions about your discharge instructions: Diet   No. Medications  No. Follow up visit  No.  Do you have questions or concerns about your Care? No.  Actions: * If pain score is 4 or above: No action needed, pain <4.

## 2017-11-04 ENCOUNTER — Encounter: Payer: Self-pay | Admitting: Internal Medicine

## 2017-11-13 DIAGNOSIS — E559 Vitamin D deficiency, unspecified: Secondary | ICD-10-CM | POA: Diagnosis not present

## 2017-11-13 DIAGNOSIS — E7800 Pure hypercholesterolemia, unspecified: Secondary | ICD-10-CM | POA: Diagnosis not present

## 2017-11-13 DIAGNOSIS — Z5181 Encounter for therapeutic drug level monitoring: Secondary | ICD-10-CM | POA: Diagnosis not present

## 2018-02-06 NOTE — Telephone Encounter (Signed)
Opened in error

## 2018-05-13 DIAGNOSIS — E559 Vitamin D deficiency, unspecified: Secondary | ICD-10-CM | POA: Diagnosis not present

## 2018-05-13 DIAGNOSIS — E7800 Pure hypercholesterolemia, unspecified: Secondary | ICD-10-CM | POA: Diagnosis not present

## 2018-05-13 DIAGNOSIS — Z0000 Encounter for general adult medical examination without abnormal findings: Secondary | ICD-10-CM | POA: Diagnosis not present

## 2018-05-13 DIAGNOSIS — Z125 Encounter for screening for malignant neoplasm of prostate: Secondary | ICD-10-CM | POA: Diagnosis not present

## 2018-11-20 DIAGNOSIS — N486 Induration penis plastica: Secondary | ICD-10-CM | POA: Diagnosis not present

## 2018-11-20 DIAGNOSIS — Z5181 Encounter for therapeutic drug level monitoring: Secondary | ICD-10-CM | POA: Diagnosis not present

## 2018-11-20 DIAGNOSIS — E7800 Pure hypercholesterolemia, unspecified: Secondary | ICD-10-CM | POA: Diagnosis not present

## 2018-11-20 DIAGNOSIS — E559 Vitamin D deficiency, unspecified: Secondary | ICD-10-CM | POA: Diagnosis not present

## 2018-12-19 DIAGNOSIS — H04123 Dry eye syndrome of bilateral lacrimal glands: Secondary | ICD-10-CM | POA: Diagnosis not present

## 2018-12-19 DIAGNOSIS — H2513 Age-related nuclear cataract, bilateral: Secondary | ICD-10-CM | POA: Diagnosis not present

## 2018-12-19 DIAGNOSIS — H1045 Other chronic allergic conjunctivitis: Secondary | ICD-10-CM | POA: Diagnosis not present

## 2018-12-19 DIAGNOSIS — H25013 Cortical age-related cataract, bilateral: Secondary | ICD-10-CM | POA: Diagnosis not present

## 2019-02-26 ENCOUNTER — Ambulatory Visit: Payer: BC Managed Care – PPO | Admitting: Podiatry

## 2019-02-26 ENCOUNTER — Other Ambulatory Visit: Payer: Self-pay | Admitting: Podiatry

## 2019-02-26 ENCOUNTER — Ambulatory Visit (INDEPENDENT_AMBULATORY_CARE_PROVIDER_SITE_OTHER): Payer: BC Managed Care – PPO

## 2019-02-26 ENCOUNTER — Other Ambulatory Visit: Payer: Self-pay

## 2019-02-26 ENCOUNTER — Encounter: Payer: Self-pay | Admitting: Podiatry

## 2019-02-26 VITALS — BP 117/70

## 2019-02-26 DIAGNOSIS — M25571 Pain in right ankle and joints of right foot: Secondary | ICD-10-CM

## 2019-02-26 DIAGNOSIS — M779 Enthesopathy, unspecified: Secondary | ICD-10-CM | POA: Diagnosis not present

## 2019-02-26 NOTE — Progress Notes (Signed)
Subjective:   Patient ID: Wayne [name], male   DOB: 58 y.o.   MRN: [id number]   HPI Patient states he traumatized the outside of his right ankle and it is been bothering him and now the ankle joint itself seems tender and he likes to be active and it is inhibiting that.  Patient does not smoke   Review of Systems  All other systems reviewed and are negative.       Objective:  Physical Exam Vitals signs and nursing note reviewed.  Constitutional:      Appearance: He is well-developed.  Pulmonary:     Effort: Pulmonary effort is normal.  Musculoskeletal: Normal range of motion.  Skin:    General: Skin is warm.  Neurological:     Mental Status: He is alert.     Neurovascular status intact muscle strength adequate range of motion within normal limits with patient found to have discomfort in the sinus tarsi right with inflammation and discomfort in the lateral side of the foot around the fibula that appears to be more traumatic in its orientation.  No range of motion loss and no crepitus noted     Assessment:  Traumatized right ankle with probability for inflammatory capsulitis the subtalar joint     Plan:  H&P ankle x-rays reviewed and today I did sterile prep and injected the sinus tarsi 3 mg Kenalog 5 mg Xylocaine advised on ice therapy and reappoint as symptoms indicate over the next 4 to 6 weeks  X-rays are negative for signs of fracture negative for signs of pathology at the current time

## 2019-07-03 DIAGNOSIS — Z03818 Encounter for observation for suspected exposure to other biological agents ruled out: Secondary | ICD-10-CM | POA: Diagnosis not present

## 2019-07-30 ENCOUNTER — Ambulatory Visit: Payer: Self-pay | Attending: Internal Medicine

## 2019-07-30 DIAGNOSIS — Z23 Encounter for immunization: Secondary | ICD-10-CM | POA: Insufficient documentation

## 2019-07-30 NOTE — Progress Notes (Signed)
   Covid-19 Vaccination Clinic  Name:  Wayne [name]    MRN: [id number] DOB: 10/18/1960  07/30/2019  Mr. Wayne [name] was observed post Covid-19 immunization for 15 minutes without incident. He was provided with Vaccine Information Sheet and instruction to access the V-Safe system.   Mr. Wayne [name] was instructed to call 911 with any severe reactions post vaccine: Marland Kitchen Difficulty breathing  . Swelling of face and throat  . A fast heartbeat  . A bad rash all over body  . Dizziness and weakness   Immunizations Administered    Name Date Dose VIS Date Route   Pfizer COVID-19 Vaccine 07/30/2019  6:21 PM 0.3 mL 05/08/2019 Intramuscular   Manufacturer: [place]   Lot: [id number]   [place]: [id number]

## 2019-08-26 ENCOUNTER — Ambulatory Visit: Payer: Self-pay | Attending: Internal Medicine

## 2019-08-26 DIAGNOSIS — Z23 Encounter for immunization: Secondary | ICD-10-CM

## 2019-08-26 NOTE — Progress Notes (Signed)
   Covid-19 Vaccination Clinic  Name:  Wayne [name]    MRN: [id number] DOB: [birth year]  08/26/2019  [name] was observed post Covid-19 immunization for 15 minutes without incident. He was provided with Vaccine Information Sheet and instruction to access the V-Safe system.   [name] was instructed to call 911 with any severe reactions post vaccine: Marland Kitchen Difficulty breathing  . Swelling of face and throat  . A fast heartbeat  . A bad rash all over body  . Dizziness and weakness   Immunizations Administered    Name Date Dose VIS Date Route   Pfizer COVID-19 Vaccine 08/26/2019 11:38 AM 0.3 mL 05/08/2019 Intramuscular   Manufacturer: [place]   Lot: [id number]   [place]: [id number]

## 2019-11-27 DIAGNOSIS — E7800 Pure hypercholesterolemia, unspecified: Secondary | ICD-10-CM | POA: Diagnosis not present

## 2019-11-27 DIAGNOSIS — Z5181 Encounter for therapeutic drug level monitoring: Secondary | ICD-10-CM | POA: Diagnosis not present

## 2019-11-27 DIAGNOSIS — E559 Vitamin D deficiency, unspecified: Secondary | ICD-10-CM | POA: Diagnosis not present

## 2019-11-27 DIAGNOSIS — Z125 Encounter for screening for malignant neoplasm of prostate: Secondary | ICD-10-CM | POA: Diagnosis not present

## 2019-12-04 DIAGNOSIS — E559 Vitamin D deficiency, unspecified: Secondary | ICD-10-CM | POA: Diagnosis not present

## 2019-12-04 DIAGNOSIS — E7800 Pure hypercholesterolemia, unspecified: Secondary | ICD-10-CM | POA: Diagnosis not present

## 2019-12-04 DIAGNOSIS — Z125 Encounter for screening for malignant neoplasm of prostate: Secondary | ICD-10-CM | POA: Diagnosis not present

## 2020-06-03 DIAGNOSIS — Z5181 Encounter for therapeutic drug level monitoring: Secondary | ICD-10-CM | POA: Diagnosis not present

## 2020-06-03 DIAGNOSIS — Z23 Encounter for immunization: Secondary | ICD-10-CM | POA: Diagnosis not present

## 2020-06-03 DIAGNOSIS — E7800 Pure hypercholesterolemia, unspecified: Secondary | ICD-10-CM | POA: Diagnosis not present

## 2020-06-03 DIAGNOSIS — M722 Plantar fascial fibromatosis: Secondary | ICD-10-CM | POA: Diagnosis not present

## 2020-06-03 DIAGNOSIS — E559 Vitamin D deficiency, unspecified: Secondary | ICD-10-CM | POA: Diagnosis not present

## 2020-07-04 DIAGNOSIS — F419 Anxiety disorder, unspecified: Secondary | ICD-10-CM | POA: Diagnosis not present

## 2020-07-19 ENCOUNTER — Telehealth: Payer: Self-pay

## 2020-07-19 NOTE — Telephone Encounter (Signed)
Left message for patient to call back to schedule appt with Dr. Tamala Julian.

## 2020-07-27 DIAGNOSIS — F411 Generalized anxiety disorder: Secondary | ICD-10-CM | POA: Diagnosis not present

## 2020-07-29 NOTE — Progress Notes (Signed)
[facility] [address] [place] Phone: [phone] Subjective:   I Kandace Blitz am serving as a Education administrator for Dr. Hulan Saas.  This visit occurred during the SARS-CoV-2 public health emergency.  Safety protocols were in place, including screening questions prior to the visit, additional usage of staff PPE, and extensive cleaning of exam room while observing appropriate contact time as indicated for disinfecting solutions.   I'm seeing this patient by the request  of:  Pcp, No  CC: Right shoulder pain  [id number]  Wayne [name] is a 60 y.o. male coming in with complaint of right shoulder pain. Patient states the shoulder is TTP. ROM is limited. Pain radiates down the arm into the bicep. Has some neck pain at times.   Onset- Chronic  Location - Deltoid  Duration- mostly painful with certain ROM  Character- sharp Aggravating factors- certain ROM and ADLs Reliving factors-  Therapies tried- has not tried anything  Severity-5 out of 10 at all times.     Past Medical History:  Diagnosis Date  . Allergy    seasonal  allergies  . GERD (gastroesophageal reflux disease)   . Heart murmur   . Hyperlipidemia   . Migraine, unspecified, without mention of intractable migraine without mention of status migrainosus 11/24/2013  . Migraines    Past Surgical History:  Procedure Laterality Date  . COLONOSCOPY    . POLYPECTOMY    . ruptured right achilles repair  02/22/2010   Social History   Socioeconomic History  . Marital status: Single    Spouse name: Not on file  . Number of children: Not on file  . Years of education: Not on file  . Highest education level: Not on file  Occupational History  . Occupation: Press photographer, some college  Tobacco Use  . Smoking status: Never Smoker  . Smokeless tobacco: Never Used  Substance and Sexual Activity  . Alcohol use: Yes    Alcohol/week: 3.0 standard drinks    Types: 3 Cans of beer per week   . Drug use: No  . Sexual activity: Not on file  Other Topics Concern  . Not on file  Social History Narrative  . Not on file   Social Determinants of Health   Financial Resource Strain: Not on file  Food Insecurity: Not on file  Transportation Needs: Not on file  Physical Activity: Not on file  Stress: Not on file  Social Connections: Not on file   No Known Allergies Family History  Problem Relation Age of Onset  . Hyperlipidemia Mother   . Cancer Mother        lung cancer  . Colon cancer Neg Hx   . Stomach cancer Neg Hx   . Colon polyps Neg Hx   . Esophageal cancer Neg Hx   . Rectal cancer Neg Hx       Current Outpatient Medications (Cardiovascular):  .  pravastatin (PRAVACHOL) 20 MG tablet, Take 20 mg by mouth daily.      Current Outpatient Medications (Analgesics):  .  aspirin EC 81 MG tablet, Take 1 tablet (81 mg total) by mouth daily. .  SUMAtriptan (IMITREX) 100 MG tablet, TAKE 1 TABLET (100 MG TOTAL) BY MOUTH EVERY 2 (TWO) HOURS AS NEEDED FORMIGRAINE     Current Outpatient Medications (Other):  .  aluminum chloride (DRYSOL) 20 % external solution, Apply topically at bedtime. .  bisacodyl (DULCOLAX) 5 MG EC tablet, Take 5 mg by mouth daily  as needed for moderate constipation. .  cholecalciferol (VITAMIN D) 1000 UNITS tablet, Take 1,000 Units by mouth daily. .  Coenzyme Q10 (COQ10 PO), Take by mouth. .  cyclobenzaprine (FLEXERIL) 10 MG tablet, Take 1 tablet (10 mg total) by mouth 3 (three) times daily as needed for muscle spasms. .  Ginger, Zingiber officinalis, (GINGER PO), Take by mouth daily. .  Misc Natural Products (GINSENG COMPLEX PO), Take by mouth daily. .  Multiple Vitamin (MULTIVITAMIN) tablet, Take 1 tablet by mouth daily. .  Omega-3 Fatty Acids (OMEGA-3 FISH OIL PO), Take by mouth. .  polyethylene glycol powder (GLYCOLAX/MIRALAX) 17 GM/SCOOP powder, Take 1 Container by mouth once. .  ranitidine (ZANTAC) 150 MG tablet, Take 1 tablet (150 mg  total) by mouth at bedtime.  Current Facility-Administered Medications (Other):  .  0.9 %  sodium chloride infusion   Reviewed prior external information including notes and imaging from  primary care provider As well as notes that were available from care everywhere and other healthcare systems.  Past medical history, social, surgical and family history all reviewed in electronic medical record.  No pertanent information unless stated regarding to the chief complaint.   Review of Systems:  No headache, visual changes, nausea, vomiting, diarrhea, constipation, dizziness, abdominal pain, skin rash, fevers, chills, night sweats, weight loss, swollen lymph nodes, body aches, joint swelling, chest pain, shortness of breath, mood changes. POSITIVE muscle aches  Objective  Blood pressure 120/78, pulse 67, height 5\' 8"  (1.727 m), weight 164 lb (74.4 kg), SpO2 97 %.   General: No apparent distress alert and oriented x3 mood and affect normal, dressed appropriately.  HEENT: Pupils equal, extraocular movements intact  Respiratory: Patient's speak in full sentences and does not appear short of breath  Cardiovascular: No lower extremity edema, non tender, no erythema  Gait normal with good balance and coordination.  MSK: Right shoulder exam does have some mild limited range of motion.  Patient does have a severely positive crossover test.  Mild positive empty can.  Patient does have limited range of motion in all planes of 5 degrees at least.  Neurovascularly intact distally.  Limited musculoskeletal ultrasound was performed and interpreted by Lyndal Pulley  Limited ultrasound of patient's right shoulder shows severe arthritic changes of the acromioclavicular joint with effusion noted.  Patient does have a degenerative tear noted of the supraspinatus noted.  No significant retraction.  He does have some scar tissue formation.  Subscapularis and biceps tendon appears to be unremarkable. Impression:  Acromioclavicular arthritis with degenerative changes of the rotator cuff  Procedure: Real-time Ultrasound Guided Injection of right acromioclavicular joint Device: GE Logiq Q7 Ultrasound guided injection is preferred based studies that show increased duration, increased effect, greater accuracy, decreased procedural pain, increased response rate, and decreased cost with ultrasound guided versus blind injection.  Verbal informed consent obtained.  Time-out conducted.  Noted no overlying erythema, induration, or other signs of local infection.  Skin prepped in a sterile fashion.  Local anesthesia: Topical Ethyl chloride.  With sterile technique and under real time ultrasound guidance: With a 25-gauge half inch needle injected with 0.5 cc of 0.5% Marcaine and 0.5 cc of Kenalog 40 mg/mL Completed without difficulty  Pain immediately resolved suggesting accurate placement of the medication.  Advised to call if fevers/chills, erythema, induration, drainage, or persistent bleeding.  Impression: Technically successful ultrasound guided injection.   [id number]; 15 additional minutes spent for Therapeutic exercises as stated in above notes.  This included exercises focusing on  stretching, strengthening, with significant focus on eccentric aspects.   Long term goals include an improvement in range of motion, strength, endurance as well as avoiding reinjury. Patient's frequency would include in 1-2 times a day, 3-5 times a week for a duration of 6-12 weeks. Shoulder Exercises that included:  Basic scapular stabilization to include adduction and depression of scapula Scaption, focusing on proper movement and good control Internal and External rotation utilizing a theraband, with elbow tucked at side entire time Rows with theraband   Proper technique shown and discussed handout in great detail with ATC.  All questions were discussed and answered.     Impression and Recommendations:     The above  documentation has been reviewed and is accurate and complete Lyndal Pulley, DO

## 2020-08-01 ENCOUNTER — Other Ambulatory Visit: Payer: Self-pay

## 2020-08-01 ENCOUNTER — Ambulatory Visit: Payer: Self-pay

## 2020-08-01 ENCOUNTER — Encounter: Payer: Self-pay | Admitting: Family Medicine

## 2020-08-01 ENCOUNTER — Ambulatory Visit: Payer: BC Managed Care – PPO | Admitting: Family Medicine

## 2020-08-01 VITALS — BP 120/78 | HR 67 | Ht 68.0 in | Wt 164.0 lb

## 2020-08-01 DIAGNOSIS — G8929 Other chronic pain: Secondary | ICD-10-CM

## 2020-08-01 DIAGNOSIS — M25511 Pain in right shoulder: Secondary | ICD-10-CM | POA: Diagnosis not present

## 2020-08-01 DIAGNOSIS — M19019 Primary osteoarthritis, unspecified shoulder: Secondary | ICD-10-CM | POA: Insufficient documentation

## 2020-08-01 DIAGNOSIS — M19011 Primary osteoarthritis, right shoulder: Secondary | ICD-10-CM

## 2020-08-01 NOTE — Assessment & Plan Note (Signed)
Patient given injection on the right side.  Tolerated the procedure well.  Discussed with patient about icing regimen and home exercises.  We will get x-rays to further evaluate the amount of the arthritic changes.  Discussed icing regimen and home exercises. Follow-up with me again in 6 weeks.  Worsening pain consider the possibility of advanced imaging to rule out any type of rotator cuff pathology that could be contributing as well as consider formal physical therapy.

## 2020-08-01 NOTE — Patient Instructions (Signed)
Good to see you Injection today Xray of the shoulder today Pen[place]aid small amount over most painful spot Exercise 3 times a week See me again in 4-6 weeks if no better we will consider MRI

## 2020-08-15 DIAGNOSIS — F411 Generalized anxiety disorder: Secondary | ICD-10-CM | POA: Diagnosis not present

## 2020-09-05 DIAGNOSIS — F411 Generalized anxiety disorder: Secondary | ICD-10-CM | POA: Diagnosis not present

## 2020-09-06 NOTE — Progress Notes (Deleted)
[place] [place] Phone: [phone] Subjective:    I'm seeing this patient by the request  of:  Pcp, No  CC: right shoulder pain   [id number]   08/01/2020 Patient given injection on the right side.  Tolerated the procedure well.  Discussed with patient about icing regimen and home exercises.  We will get x-rays to further evaluate the amount of the arthritic changes.  Discussed icing regimen and home exercises. Follow-up with me again in 6 weeks.  Worsening pain consider the possibility of advanced imaging to rule out any type of rotator cuff pathology that could be contributing as well as consider formal physical therapy.  Update 09/06/2020 TAYJON HALLADAY is a 60 y.o. male coming in with complaint of right shoulder pain. Patient states    NEED HIM TO GET XRAY    Past Medical History:  Diagnosis Date  . Allergy    seasonal  allergies  . GERD (gastroesophageal reflux disease)   . Heart murmur   . Hyperlipidemia   . Migraine, unspecified, without mention of intractable migraine without mention of status migrainosus 11/24/2013  . Migraines    Past Surgical History:  Procedure Laterality Date  . COLONOSCOPY    . POLYPECTOMY    . ruptured right achilles repair  02/22/2010   Social History   Socioeconomic History  . Marital status: Single    Spouse name: Not on file  . Number of children: Not on file  . Years of education: Not on file  . Highest education level: Not on file  Occupational History  . Occupation: Press photographer, some college  Tobacco Use  . Smoking status: Never Smoker  . Smokeless tobacco: Never Used  Substance and Sexual Activity  . Alcohol use: Yes    Alcohol/week: 3.0 standard drinks    Types: 3 Cans of beer per week  . Drug use: No  . Sexual activity: Not on file  Other Topics Concern  . Not on file  Social History Narrative  . Not on file   Social Determinants of Health   Financial  Resource Strain: Not on file  Food Insecurity: Not on file  Transportation Needs: Not on file  Physical Activity: Not on file  Stress: Not on file  Social Connections: Not on file   No Known Allergies Family History  Problem Relation Age of Onset  . Hyperlipidemia Mother   . Cancer Mother        lung cancer  . Colon cancer Neg Hx   . Stomach cancer Neg Hx   . Colon polyps Neg Hx   . Esophageal cancer Neg Hx   . Rectal cancer Neg Hx       Current Outpatient Medications (Cardiovascular):  .  pravastatin (PRAVACHOL) 20 MG tablet, Take 20 mg by mouth daily.      Current Outpatient Medications (Analgesics):  .  aspirin EC 81 MG tablet, Take 1 tablet (81 mg total) by mouth daily. .  SUMAtriptan (IMITREX) 100 MG tablet, TAKE 1 TABLET (100 MG TOTAL) BY MOUTH EVERY 2 (TWO) HOURS AS NEEDED FORMIGRAINE     Current Outpatient Medications (Other):  .  aluminum chloride (DRYSOL) 20 % external solution, Apply topically at bedtime. .  bisacodyl (DULCOLAX) 5 MG EC tablet, Take 5 mg by mouth daily as needed for moderate constipation. .  cholecalciferol (VITAMIN D) 1000 UNITS tablet, Take 1,000 Units by mouth daily. .  Coenzyme Q10 (COQ10 PO), Take by  mouth. .  cyclobenzaprine (FLEXERIL) 10 MG tablet, Take 1 tablet (10 mg total) by mouth 3 (three) times daily as needed for muscle spasms. .  Ginger, Zingiber officinalis, (GINGER PO), Take by mouth daily. .  Misc Natural Products (GINSENG COMPLEX PO), Take by mouth daily. .  Multiple Vitamin (MULTIVITAMIN) tablet, Take 1 tablet by mouth daily. .  Omega-3 Fatty Acids (OMEGA-3 FISH OIL PO), Take by mouth. .  polyethylene glycol powder (GLYCOLAX/MIRALAX) 17 GM/SCOOP powder, Take 1 Container by mouth once. .  ranitidine (ZANTAC) 150 MG tablet, Take 1 tablet (150 mg total) by mouth at bedtime.  Current Facility-Administered Medications (Other):  .  0.9 %  sodium chloride infusion   Reviewed prior external information including notes and  imaging from  primary care provider As well as notes that were available from care everywhere and other healthcare systems.  Past medical history, social, surgical and family history all reviewed in electronic medical record.  No pertanent information unless stated regarding to the chief complaint.   Review of Systems:  No headache, visual changes, nausea, vomiting, diarrhea, constipation, dizziness, abdominal pain, skin rash, fevers, chills, night sweats, weight loss, swollen lymph nodes, body aches, joint swelling, chest pain, shortness of breath, mood changes. POSITIVE muscle aches  Objective  There were no vitals taken for this visit.   General: No apparent distress alert and oriented x3 mood and affect normal, dressed appropriately.  HEENT: Pupils equal, extraocular movements intact  Respiratory: Patient's speak in full sentences and does not appear short of breath  Cardiovascular: No lower extremity edema, non tender, no erythema  Gait normal with good balance and coordination.  MSK:  Non tender with full range of motion and good stability and symmetric strength and tone of shoulders, elbows, wrist, hip, knee and ankles bilaterally.     Impression and Recommendations:     The above documentation has been reviewed and is accurate and complete Lyndal Pulley, DO

## 2020-09-07 ENCOUNTER — Ambulatory Visit: Payer: BC Managed Care – PPO | Admitting: Family Medicine

## 2020-10-17 DIAGNOSIS — F411 Generalized anxiety disorder: Secondary | ICD-10-CM | POA: Diagnosis not present

## 2020-10-18 NOTE — Progress Notes (Signed)
[facility] [place] Phone: [phone] Subjective:   Fontaine No, am serving as a scribe for Dr. Hulan Saas. This visit occurred during the SARS-CoV-2 public health emergency.  Safety protocols were in place, including screening questions prior to the visit, additional usage of staff PPE, and extensive cleaning of exam room while observing appropriate contact time as indicated for disinfecting solutions.   I'm seeing this patient by the request  of:  Pcp, No  CC: Shoulder pain follow-up  [id number]   08/01/2020 Patient given injection on the right side.  Tolerated the procedure well.  Discussed with patient about icing regimen and home exercises.  We will get x-rays to further evaluate the amount of the arthritic changes.  Discussed icing regimen and home exercises. Follow-up with me again in 6 weeks.  Worsening pain consider the possibility of advanced imaging to rule out any type of rotator cuff pathology that could be contributing as well as consider formal physical therapy.   LACHARLES ALTSCHULER is a 60 y.o. male coming in with complaint of R AC joint pain.  Patient last exam was given injection.  Patient states that the injection helped to alleviate his pain and restore his ROM.  Patient states that overall he feels approximately 90% better.  Is able to workout on a regular basis.      Past Medical History:  Diagnosis Date  . Allergy    seasonal  allergies  . GERD (gastroesophageal reflux disease)   . Heart murmur   . Hyperlipidemia   . Migraine, unspecified, without mention of intractable migraine without mention of status migrainosus 11/24/2013  . Migraines    Past Surgical History:  Procedure Laterality Date  . COLONOSCOPY    . POLYPECTOMY    . ruptured right achilles repair  02/22/2010   Social History   Socioeconomic History  . Marital status: Single    Spouse name: Not on file  . Number of children:  Not on file  . Years of education: Not on file  . Highest education level: Not on file  Occupational History  . Occupation: Press photographer, some college  Tobacco Use  . Smoking status: Never Smoker  . Smokeless tobacco: Never Used  Substance and Sexual Activity  . Alcohol use: Yes    Alcohol/week: 3.0 standard drinks    Types: 3 Cans of beer per week  . Drug use: No  . Sexual activity: Not on file  Other Topics Concern  . Not on file  Social History Narrative  . Not on file   Social Determinants of Health   Financial Resource Strain: Not on file  Food Insecurity: Not on file  Transportation Needs: Not on file  Physical Activity: Not on file  Stress: Not on file  Social Connections: Not on file   No Known Allergies Family History  Problem Relation Age of Onset  . Hyperlipidemia Mother   . Cancer Mother        lung cancer  . Colon cancer Neg Hx   . Stomach cancer Neg Hx   . Colon polyps Neg Hx   . Esophageal cancer Neg Hx   . Rectal cancer Neg Hx       Current Outpatient Medications (Cardiovascular):  .  pravastatin (PRAVACHOL) 20 MG tablet, Take 20 mg by mouth daily.      Current Outpatient Medications (Analgesics):  .  aspirin EC 81 MG tablet, Take 1 tablet (81 mg total)  by mouth daily. .  SUMAtriptan (IMITREX) 100 MG tablet, TAKE 1 TABLET (100 MG TOTAL) BY MOUTH EVERY 2 (TWO) HOURS AS NEEDED FORMIGRAINE     Current Outpatient Medications (Other):  .  aluminum chloride (DRYSOL) 20 % external solution, Apply topically at bedtime. .  bisacodyl (DULCOLAX) 5 MG EC tablet, Take 5 mg by mouth daily as needed for moderate constipation. .  cholecalciferol (VITAMIN D) 1000 UNITS tablet, Take 1,000 Units by mouth daily. .  Coenzyme Q10 (COQ10 PO), Take by mouth. .  cyclobenzaprine (FLEXERIL) 10 MG tablet, Take 1 tablet (10 mg total) by mouth 3 (three) times daily as needed for muscle spasms. .  Ginger, Zingiber officinalis, (GINGER PO), Take by mouth daily. .  Misc Natural  Products (GINSENG COMPLEX PO), Take by mouth daily. .  Multiple Vitamin (MULTIVITAMIN) tablet, Take 1 tablet by mouth daily. .  Omega-3 Fatty Acids (OMEGA-3 FISH OIL PO), Take by mouth. .  polyethylene glycol powder (GLYCOLAX/MIRALAX) 17 GM/SCOOP powder, Take 1 Container by mouth once. .  ranitidine (ZANTAC) 150 MG tablet, Take 1 tablet (150 mg total) by mouth at bedtime.  Current Facility-Administered Medications (Other):  .  0.9 %  sodium chloride infusion   Reviewed prior external information including notes and imaging from  primary care provider As well as notes that were available from care everywhere and other healthcare systems.  Past medical history, social, surgical and family history all reviewed in electronic medical record.  No pertanent information unless stated regarding to the chief complaint.   Review of Systems:  No headache, visual changes, nausea, vomiting, diarrhea, constipation, dizziness, abdominal pain, skin rash, fevers, chills, night sweats, weight loss, swollen lymph nodes, body aches, joint swelling, chest pain, shortness of breath, mood changes. POSITIVE muscle aches  Objective  Blood pressure 112/80, pulse 75, height 5\' 8"  (1.727 m), weight 162 lb (73.5 kg), SpO2 99 %.   General: No apparent distress alert and oriented x3 mood and affect normal, dressed appropriately.  HEENT: Pupils equal, extraocular movements intact  Respiratory: Patient's speak in full sentences and does not appear short of breath  Cardiovascular: No lower extremity edema, non tender, no erythema  Gait normal with good balance and coordination.  MSK: Right shoulder exam shows very mild positive impingement.  Rotator cuff strength 5 out of 5.  Patient has a negative crossover which is an improvement from previous exam.  Limited musculoskeletal ultrasound was performed and interpreted by Lyndal Pulley  Limited ultrasound of patient's right shoulder shows the patient does have some very  mild degenerative changes noted of the supraspinatus.  Patient does have a very small subacromial bursitis noted.  Hypoechoic changes within the bicep tendon sheath is noted.  Patient's acromioclavicular joint though has significant decrease in hypoechoic changes from previous exam. Impression: Patient does have what appears to be more of a bicep tendinitis and mild rotator cuff pathology.  Resolved inflammation of the acromioclavicular joint    Impression and Recommendations:     The above documentation has been reviewed and is accurate and complete Lyndal Pulley, DO

## 2020-10-19 ENCOUNTER — Other Ambulatory Visit: Payer: Self-pay

## 2020-10-19 ENCOUNTER — Ambulatory Visit: Payer: Self-pay

## 2020-10-19 ENCOUNTER — Encounter: Payer: Self-pay | Admitting: Family Medicine

## 2020-10-19 ENCOUNTER — Ambulatory Visit: Payer: BC Managed Care – PPO | Admitting: Family Medicine

## 2020-10-19 VITALS — BP 112/80 | HR 75 | Ht 68.0 in | Wt 162.0 lb

## 2020-10-19 DIAGNOSIS — M19011 Primary osteoarthritis, right shoulder: Secondary | ICD-10-CM | POA: Diagnosis not present

## 2020-10-19 DIAGNOSIS — M25511 Pain in right shoulder: Secondary | ICD-10-CM

## 2020-10-19 DIAGNOSIS — G8929 Other chronic pain: Secondary | ICD-10-CM | POA: Diagnosis not present

## 2020-10-19 NOTE — Patient Instructions (Signed)
Still some inflammation in the shoulder Ice after activity Keep hands in peripheral vision Continue exercises See me in 3 months but call me if you need me sooner

## 2020-10-19 NOTE — Assessment & Plan Note (Signed)
Patient is doing much better overall.  No significant swelling noted of the acromioclavicular joint.  Patient does though still have some hypoechoic changes noted of the bicep tendon and we discussed potentially if any weight weakening we do need to consider the possibility of advanced imaging.  Patient was feeling 90% better at this point.  Follow-up with me again in 3 months to make sure he is completely resolved at that time.

## 2020-12-09 DIAGNOSIS — Z23 Encounter for immunization: Secondary | ICD-10-CM | POA: Diagnosis not present

## 2020-12-09 DIAGNOSIS — E7800 Pure hypercholesterolemia, unspecified: Secondary | ICD-10-CM | POA: Diagnosis not present

## 2020-12-09 DIAGNOSIS — Z125 Encounter for screening for malignant neoplasm of prostate: Secondary | ICD-10-CM | POA: Diagnosis not present

## 2020-12-09 DIAGNOSIS — E559 Vitamin D deficiency, unspecified: Secondary | ICD-10-CM | POA: Diagnosis not present

## 2020-12-09 DIAGNOSIS — Z5181 Encounter for therapeutic drug level monitoring: Secondary | ICD-10-CM | POA: Diagnosis not present

## 2020-12-26 DIAGNOSIS — F411 Generalized anxiety disorder: Secondary | ICD-10-CM | POA: Diagnosis not present

## 2021-01-12 NOTE — Progress Notes (Signed)
[name] Phone: [phone] Subjective:   Wayne [name], am serving as a scribe for Dr. Hulan Saas.  This visit occurred during the SARS-CoV-2 public health emergency.  Safety protocols were in place, including screening questions prior to the visit, additional usage of staff PPE, and extensive cleaning of exam room while observing appropriate contact time as indicated for disinfecting solutions.   I'm seeing this patient by the request  of:  Pcp, [name]  CC: Right shoulder pain  [id number]  10/19/2020 Patient is doing much better overall.  [name] significant swelling noted of the acromioclavicular joint.  Patient does though still have some hypoechoic changes noted of the bicep tendon and we discussed potentially if any weight weakening we do need to consider the possibility of advanced imaging.  Patient was feeling 90% better at this point.  Follow-up with me again in 3 months to make sure he is completely resolved at that time.  Update 01/13/2021 Wayne [name] is a 60 y.o. male coming in with complaint of R shoulder pain. Patient states that he is doing much better after injection last visit.  Patient feels like it is doing much better overall.  Very mild discomfort overall.  Nothing that is stopping him from activities.  Last injection- R ACJ on 08/01/20      Past Medical History:  Diagnosis Date   Allergy    seasonal  allergies   GERD (gastroesophageal reflux disease)    Heart murmur    Hyperlipidemia    Migraine, unspecified, without mention of intractable migraine without mention of status migrainosus 11/24/2013   Migraines    Past Surgical History:  Procedure Laterality Date   COLONOSCOPY     POLYPECTOMY     ruptured right achilles repair  02/22/2010   Social History   Socioeconomic History   Marital status: Single    Spouse name: Not on file   Number of children: Not on file   Years of  education: Not on file   Highest education level: Not on file  Occupational History   Occupation: Press photographer, some college  Tobacco Use   Smoking status: Never   Smokeless tobacco: Never  Substance and Sexual Activity   Alcohol use: Yes    Alcohol/week: 3.0 standard drinks    Types: 3 Cans of beer per week   Drug use: [name]   Sexual activity: Not on file  Other Topics Concern   Not on file  Social History Narrative   Not on file   Social Determinants of Health   Financial Resource Strain: Not on file  Food Insecurity: Not on file  Transportation Needs: Not on file  Physical Activity: Not on file  Stress: Not on file  Social Connections: Not on file   [name] Known Allergies Family History  Problem Relation Age of Onset   Hyperlipidemia Mother    Cancer Mother        lung cancer   Colon cancer Neg Hx    Stomach cancer Neg Hx    Colon polyps Neg Hx    Esophageal cancer Neg Hx    Rectal cancer Neg Hx       Current Outpatient Medications (Cardiovascular):    pravastatin (PRAVACHOL) 20 MG tablet, Take 20 mg by mouth daily.      Current Outpatient Medications (Analgesics):    aspirin EC 81 MG tablet, Take 1 tablet (81 mg total) by mouth daily.   SUMAtriptan (  IMITREX) 100 MG tablet, TAKE 1 TABLET (100 MG TOTAL) BY MOUTH EVERY 2 (TWO) HOURS AS NEEDED FORMIGRAINE     Current Outpatient Medications (Other):    aluminum chloride (DRYSOL) 20 % external solution, Apply topically at bedtime.   bisacodyl (DULCOLAX) 5 MG EC tablet, Take 5 mg by mouth daily as needed for moderate constipation.   cholecalciferol (VITAMIN D) 1000 UNITS tablet, Take 1,000 Units by mouth daily.   Coenzyme Q10 (COQ10 PO), Take by mouth.   cyclobenzaprine (FLEXERIL) 10 MG tablet, Take 1 tablet (10 mg total) by mouth 3 (three) times daily as needed for muscle spasms.   Ginger, Zingiber officinalis, (GINGER PO), Take by mouth daily.   Misc Natural Products (GINSENG COMPLEX PO), Take by mouth daily.    Multiple Vitamin (MULTIVITAMIN) tablet, Take 1 tablet by mouth daily.   Omega-3 Fatty Acids (OMEGA-3 FISH OIL PO), Take by mouth.   polyethylene glycol powder (GLYCOLAX/MIRALAX) 17 GM/SCOOP powder, Take 1 Container by mouth once.   ranitidine (ZANTAC) 150 MG tablet, Take 1 tablet (150 mg total) by mouth at bedtime.  Current Facility-Administered Medications (Other):    0.9 %  sodium chloride infusion   Reviewed prior external information including notes and imaging from  primary care provider As well as notes that were available from care everywhere and other healthcare systems.  Past medical history, social, surgical and family history all reviewed in electronic medical record.  [name] pertanent information unless stated regarding to the chief complaint.   Review of Systems:  [name] headache, visual changes, nausea, vomiting, diarrhea, constipation, dizziness, abdominal pain, skin rash, fevers, chills, night sweats, weight loss, swollen lymph nodes, body aches, joint swelling, chest pain, shortness of breath, mood changes. POSITIVE muscle aches  Objective  Blood pressure 118/70, pulse 73, height '5\' 8"'$  (1.727 m), weight 159 lb (72.1 kg), SpO2 99 %.   General: [name] apparent distress alert and oriented x3 mood and affect normal, dressed appropriately.  HEENT: Pupils equal, extraocular movements intact  Respiratory: Patient's speak in full sentences and does not appear short of breath  Cardiovascular: [name] lower extremity edema, non tender, [name] erythema  Gait normal with good balance and coordination.  MSK: Right shoulder exam shows the patient does still have mild positive crossover.  He does have pain with internal rotation but very minimal.  5 out of 5 strength of the rotator cuff noted.  Limited muscular skeletal ultrasound was performed and interpreted by Hulan Saas, M  Limited ultrasound of patient's right shoulder shows the continued acromioclavicular arthritis with hypoechoic changes noted.   In addition of this though when looking at the bicep tendon patient does have what appears to be a cyst noted approximately 2.5 to 3 cm from the origin of the anterior labrum.  Hypoechoic changes also noted within the tendon sheath and difficult to assess if this is more of an expansion of the cyst versus hypoechoic changes that would be consistent with tendinitis.  Mild increase in Doppler flow but [name] neovascularization is appreciated. Impression: Acromioclavicular arthritis with small effusion as well as bicep tendinitis with likely cyst formation    Impression and Recommendations:     The above documentation has been reviewed and is accurate and complete Wayne Pulley, DO

## 2021-01-13 ENCOUNTER — Ambulatory Visit (INDEPENDENT_AMBULATORY_CARE_PROVIDER_SITE_OTHER): Payer: BC Managed Care – PPO

## 2021-01-13 ENCOUNTER — Encounter: Payer: Self-pay | Admitting: Family Medicine

## 2021-01-13 ENCOUNTER — Ambulatory Visit: Payer: BC Managed Care – PPO | Admitting: Family Medicine

## 2021-01-13 ENCOUNTER — Ambulatory Visit: Payer: Self-pay

## 2021-01-13 ENCOUNTER — Other Ambulatory Visit: Payer: Self-pay

## 2021-01-13 VITALS — BP 118/70 | HR 73 | Ht 68.0 in | Wt 159.0 lb

## 2021-01-13 DIAGNOSIS — G8929 Other chronic pain: Secondary | ICD-10-CM

## 2021-01-13 DIAGNOSIS — S46101A Unspecified injury of muscle, fascia and tendon of long head of biceps, right arm, initial encounter: Secondary | ICD-10-CM | POA: Diagnosis not present

## 2021-01-13 DIAGNOSIS — M25511 Pain in right shoulder: Secondary | ICD-10-CM

## 2021-01-13 DIAGNOSIS — M19011 Primary osteoarthritis, right shoulder: Secondary | ICD-10-CM | POA: Diagnosis not present

## 2021-01-13 NOTE — Assessment & Plan Note (Signed)
Patient on ultrasound today though does have what appears to be a cyst formation noted approximately 2.5 cm from the origin at the rotator cuff of the bicep tendon.  Questionable ganglion appearance.  He does have some hypoechoic changes.  No significant abnormal vascularity but does have increasing Doppler flow suggestive of tendinitis as well.  Discussed with patient that we will continue to monitor.  If continues to give trouble or increase in size I would consider the possibility of advanced imaging.  Can also consider injection but have already warned patient about the potential risk of rupture.  Patient will follow up with me again in 2 months otherwise for [place] to reevaluate.

## 2021-01-13 NOTE — Assessment & Plan Note (Signed)
Does have the arthritic changes of the acromioclavicular joint on ultrasound today still has significant swelling.  Discussed with patient about monitoring.  Worsening pain we can consider injection.  Follow-up again in 2 months

## 2021-01-13 NOTE — Patient Instructions (Signed)
Watch cyst-if more pain let me know Keep hands in peripheral vision Xray on way out See me in 2 months to check on cyst

## 2021-01-23 DIAGNOSIS — F411 Generalized anxiety disorder: Secondary | ICD-10-CM | POA: Diagnosis not present

## 2021-03-14 NOTE — Progress Notes (Signed)
[place] Phone: [phone] Subjective:    I'm seeing this patient by the request  of:  Pcp, No  CC: Right shoulder pain follow-up  FHL:[id number]  01/13/2021 Patient on ultrasound today though does have what appears to be a cyst formation noted approximately 2.5 cm from the origin at the rotator cuff of the bicep tendon.  Questionable ganglion appearance.  He does have some hypoechoic changes.  No significant abnormal vascularity but does have increasing Doppler flow suggestive of tendinitis as well.  Discussed with patient that we will continue to monitor.  If continues to give trouble or increase in size I would consider the possibility of advanced imaging.  Can also consider injection but have already warned patient about the potential risk of rupture.  Patient will follow up with me again in 2 months otherwise for [place] to reevaluate.  Does have the arthritic changes of the acromioclavicular joint on ultrasound today still has significant swelling.  Discussed with patient about monitoring.  Worsening pain we can consider injection.  Follow-up again in 2 months  Update 03/15/2021 Wayne [name] is a 60 y.o. male coming in with complaint of R shoulder pain. Patient states that he continues to have intermittent pain in anterior shoulder. Pain is improving though.   Xray R shoulder 01/13/2021 IMPRESSION: 1. Minimal acromioclavicular and glenohumeral osteoarthritis. 2. Tiny subacromial spur.     Past Medical History:  Diagnosis Date   Allergy    seasonal  allergies   GERD (gastroesophageal reflux disease)    Heart murmur    Hyperlipidemia    Migraine, unspecified, without mention of intractable migraine without mention of status migrainosus 11/24/2013   Migraines    Past Surgical History:  Procedure Laterality Date   COLONOSCOPY     POLYPECTOMY     ruptured right achilles repair  02/22/2010   Social History    Socioeconomic History   Marital status: Single    Spouse name: Not on file   Number of children: Not on file   Years of education: Not on file   Highest education level: Not on file  Occupational History   Occupation: sales, some college  Tobacco Use   Smoking status: Never   Smokeless tobacco: Never  Substance and Sexual Activity   Alcohol use: Yes    Alcohol/week: 3.0 standard drinks    Types: 3 Cans of beer per week   Drug use: No   Sexual activity: Not on file  Other Topics Concern   Not on file  Social History Narrative   Not on file   Social Determinants of Health   Financial Resource Strain: Not on file  Food Insecurity: Not on file  Transportation Needs: Not on file  Physical Activity: Not on file  Stress: Not on file  Social Connections: Not on file   No Known Allergies Family History  Problem Relation Age of Onset   Hyperlipidemia Mother    Cancer Mother        lung cancer   Colon cancer Neg Hx    Stomach cancer Neg Hx    Colon polyps Neg Hx    Esophageal cancer Neg Hx    Rectal cancer Neg Hx       Current Outpatient Medications (Cardiovascular):    pravastatin (PRAVACHOL) 20 MG tablet, Take 20 mg by mouth daily.      Current Outpatient Medications (Analgesics):    aspirin EC 81 MG tablet,  Take 1 tablet (81 mg total) by mouth daily.   SUMAtriptan (IMITREX) 100 MG tablet, TAKE 1 TABLET (100 MG TOTAL) BY MOUTH EVERY 2 (TWO) HOURS AS NEEDED FORMIGRAINE     Current Outpatient Medications (Other):    aluminum chloride (DRYSOL) 20 % external solution, Apply topically at bedtime.   bisacodyl (DULCOLAX) 5 MG EC tablet, Take 5 mg by mouth daily as needed for moderate constipation.   cholecalciferol (VITAMIN D) 1000 UNITS tablet, Take 1,000 Units by mouth daily.   Coenzyme Q10 (COQ10 PO), Take by mouth.   cyclobenzaprine (FLEXERIL) 10 MG tablet, Take 1 tablet (10 mg total) by mouth 3 (three) times daily as needed for muscle spasms.   Ginger,  Zingiber officinalis, (GINGER PO), Take by mouth daily.   Misc Natural Products (GINSENG COMPLEX PO), Take by mouth daily.   Multiple Vitamin (MULTIVITAMIN) tablet, Take 1 tablet by mouth daily.   Omega-3 Fatty Acids (OMEGA-3 FISH OIL PO), Take by mouth.   polyethylene glycol powder (GLYCOLAX/MIRALAX) 17 GM/SCOOP powder, Take 1 Container by mouth once.   ranitidine (ZANTAC) 150 MG tablet, Take 1 tablet (150 mg total) by mouth at bedtime.  Current Facility-Administered Medications (Other):    0.9 %  sodium chloride infusion   Reviewed prior external information including notes and imaging from  primary care provider As well as notes that were available from care everywhere and other healthcare systems.  Past medical history, social, surgical and family history all reviewed in electronic medical record.  No pertanent information unless stated regarding to the chief complaint.   Review of Systems:  No headache, visual changes, nausea, vomiting, diarrhea, constipation, dizziness, abdominal pain, skin rash, fevers, chills, night sweats, weight loss, swollen lymph nodes, body aches, joint swelling, chest pain, shortness of breath, mood changes. POSITIVE muscle aches  Objective  Blood pressure 122/76, pulse (!) 54, height 5\' 8"  (1.727 m), weight 163 lb (73.9 kg), SpO2 99 %.   General: No apparent distress alert and oriented x3 mood and affect normal, dressed appropriately.  HEENT: Pupils equal, extraocular movements intact  Respiratory: Patient's speak in full sentences and does not appear short of breath  Cardiovascular: No lower extremity edema, non tender, no erythema  Gait normal with good balance and coordination.  MSK: Right shoulder exam shows the patient still has some tenderness to palpation on the anterior aspect of the shoulder and the acromioclavicular joint.  Minimal pain noted with the Speed test but patient has more pain with crossover.  Limited muscular skeletal ultrasound was  performed and interpreted by Hulan Saas, M  Limited ultrasound of patient's right shoulder still shows the patient does have the potential cystic formation noted within the bicep tendon sheath with some mild increase in Doppler flow surrounding the area approximately 2.5 cm from proximal aspect.  Minimal change in size. Significant increase in hypoechoic changes of the acromioclavicular joint with the moderate to severe acromioclavicular arthritis.   Procedure: Real-time Ultrasound Guided Injection of right acromioclavicular joint  device: GE Logiq Q7 Ultrasound guided injection is preferred based studies that show increased duration, increased effect, greater accuracy, decreased procedural pain, increased response rate, and decreased cost with ultrasound guided versus blind injection.  Verbal informed consent obtained.  Time-out conducted.  Noted no overlying erythema, induration, or other signs of local infection.  Skin prepped in a sterile fashion.  Local anesthesia: Topical Ethyl chloride.  With sterile technique and under real time ultrasound guidance: With a 25-gauge half inch needle injected with 0.5 cc  of 0.5% Marcaine and 0.5 cc of Kenalog 40 mg/mL. Completed without difficulty  Pain immediately resolved suggesting accurate placement of the medication.  Advised to call if fevers/chills, erythema, induration, drainage, or persistent bleeding.  Impression: Technically successful ultrasound guided injection.   Impression and Recommendations:     The above documentation has been reviewed and is accurate and complete Lyndal Pulley, DO

## 2021-03-15 ENCOUNTER — Encounter: Payer: Self-pay | Admitting: Family Medicine

## 2021-03-15 ENCOUNTER — Ambulatory Visit: Payer: BC Managed Care – PPO | Admitting: Family Medicine

## 2021-03-15 ENCOUNTER — Ambulatory Visit: Payer: Self-pay

## 2021-03-15 ENCOUNTER — Other Ambulatory Visit: Payer: Self-pay

## 2021-03-15 VITALS — BP 122/76 | HR 54 | Ht 68.0 in | Wt 163.0 lb

## 2021-03-15 DIAGNOSIS — M19011 Primary osteoarthritis, right shoulder: Secondary | ICD-10-CM

## 2021-03-15 DIAGNOSIS — M25511 Pain in right shoulder: Secondary | ICD-10-CM

## 2021-03-15 NOTE — Patient Instructions (Signed)
AC joint injection Give it 2 weeks and will consider MRI if not better See me in 4-6 weeks

## 2021-03-15 NOTE — Assessment & Plan Note (Signed)
Repeat injection given again today.  Tolerated the procedure well.  Discussed with patient about the possibility though of having the cyst in the shoulder and would possibly need advanced imaging.  We will see how patient responds again to this which did help him significantly previously and did have complete range of motion and decrease pain.  Patient does have a cyst noted of the bicep tendon that would need further evaluation if this continues.

## 2021-03-20 DIAGNOSIS — F411 Generalized anxiety disorder: Secondary | ICD-10-CM | POA: Diagnosis not present

## 2021-04-14 NOTE — Progress Notes (Signed)
[place] Phone: [phone] Subjective:   Wayne [name], am serving as a scribe for Dr. Hulan Saas.  This visit occurred during the SARS-CoV-2 public health emergency.  Safety protocols were in place, including screening questions prior to the visit, additional usage of staff PPE, and extensive cleaning of exam room while observing appropriate contact time as indicated for disinfecting solutions.    CC: Shoulder pain follow-up  LDJ:[id number]  03/15/2021 Repeat injection given again today.  Tolerated the procedure well.  Discussed with patient about the possibility though of having the cyst in the shoulder and would possibly need advanced imaging.  We will see how patient responds again to this which did help him significantly previously and did have complete range of motion and decrease pain.  Patient does have a cyst noted of the bicep tendon that would need further evaluation if this continues.  Update 04/17/2021  Wayne [name] is a 60 y.o. male coming in with complaint of R AC joint pain. Patient states that he is doing better. Had some soreness has had some mild discomfort from time to time but nothing severe.  Patient would state that he is feeling significantly better since he has been lifting weights regularly and has not noticed a change in the injection.      Past Medical History:  Diagnosis Date   Allergy    seasonal  allergies   GERD (gastroesophageal reflux disease)    Heart murmur    Hyperlipidemia    Migraine, unspecified, without mention of intractable migraine without mention of status migrainosus 11/24/2013   Migraines    Past Surgical History:  Procedure Laterality Date   COLONOSCOPY     POLYPECTOMY     ruptured right achilles repair  02/22/2010   Social History   Socioeconomic History   Marital status: Single    Spouse name: Not on file   Number of children: Not on file   Years  of education: Not on file   Highest education level: Not on file  Occupational History   Occupation: sales, some college  Tobacco Use   Smoking status: Never   Smokeless tobacco: Never  Substance and Sexual Activity   Alcohol use: Yes    Alcohol/week: 3.0 standard drinks    Types: 3 Cans of beer per week   Drug use: [name]   Sexual activity: Not on file  Other Topics Concern   Not on file  Social History Narrative   Not on file   Social Determinants of Health   Financial Resource Strain: Not on file  Food Insecurity: Not on file  Transportation Needs: Not on file  Physical Activity: Not on file  Stress: Not on file  Social Connections: Not on file   [name] Known Allergies Family History  Problem Relation Age of Onset   Hyperlipidemia Mother    Cancer Mother        lung cancer   Colon cancer Neg Hx    Stomach cancer Neg Hx    Colon polyps Neg Hx    Esophageal cancer Neg Hx    Rectal cancer Neg Hx       Current Outpatient Medications (Cardiovascular):    pravastatin (PRAVACHOL) 20 MG tablet, Take 20 mg by mouth daily.      Current Outpatient Medications (Analgesics):    aspirin EC 81 MG tablet, Take 1 tablet (81 mg total) by mouth daily.   SUMAtriptan (IMITREX) 100  MG tablet, TAKE 1 TABLET (100 MG TOTAL) BY MOUTH EVERY 2 (TWO) HOURS AS NEEDED FORMIGRAINE     Current Outpatient Medications (Other):    aluminum chloride (DRYSOL) 20 % external solution, Apply topically at bedtime.   bisacodyl (DULCOLAX) 5 MG EC tablet, Take 5 mg by mouth daily as needed for moderate constipation.   cholecalciferol (VITAMIN D) 1000 UNITS tablet, Take 1,000 Units by mouth daily.   Coenzyme Q10 (COQ10 PO), Take by mouth.   cyclobenzaprine (FLEXERIL) 10 MG tablet, Take 1 tablet (10 mg total) by mouth 3 (three) times daily as needed for muscle spasms.   Ginger, Zingiber officinalis, (GINGER PO), Take by mouth daily.   Misc Natural Products (GINSENG COMPLEX PO), Take by mouth daily.    Multiple Vitamin (MULTIVITAMIN) tablet, Take 1 tablet by mouth daily.   Omega-3 Fatty Acids (OMEGA-3 FISH OIL PO), Take by mouth.   polyethylene glycol powder (GLYCOLAX/MIRALAX) 17 GM/SCOOP powder, Take 1 Container by mouth once.   ranitidine (ZANTAC) 150 MG tablet, Take 1 tablet (150 mg total) by mouth at bedtime.  Current Facility-Administered Medications (Other):    0.9 %  sodium chloride infusion    Objective  Blood pressure 120/86, pulse 75, height 5\' 8"  (1.727 m), weight 167 lb (75.8 kg), SpO2 99 %.   General: [name] apparent distress alert and oriented x3 mood and affect normal, dressed appropriately.  HEENT: Pupils equal, extraocular movements intact  Respiratory: Patient's speak in full sentences and does not appear short of breath  Cardiovascular: [name] lower extremity edema, non tender, [name] erythema  Gait normal with good balance and coordination.  MSK: Right shoulder exam still shows some mild discomfort noted with impingement view.  Mild discomfort with empty can.  Mild discomfort with crossover test.  Limited muscular skeletal ultrasound was performed and interpreted by Hulan Saas, M  Limited ultrasound shows the patient does have the hypoechoic changes in the bicep tendon sheath but is improved from previous exam.  Patient's acromioclavicular joint seems to be improved as well.  Mild chronic scarring noted of the supraspinatus but [name] true acute tear appreciated.  Patient also has some mild calcific changes noted of the labrum. Impression: Improvement of the hypoechoic changes from previous exam continued chronic findings.    Impression and Recommendations:     The above documentation has been reviewed and is accurate and complete Lyndal Pulley, DO

## 2021-04-17 ENCOUNTER — Encounter: Payer: Self-pay | Admitting: Family Medicine

## 2021-04-17 ENCOUNTER — Ambulatory Visit: Payer: Self-pay

## 2021-04-17 ENCOUNTER — Other Ambulatory Visit: Payer: Self-pay

## 2021-04-17 ENCOUNTER — Ambulatory Visit: Payer: BC Managed Care – PPO | Admitting: Family Medicine

## 2021-04-17 VITALS — BP 120/86 | HR 75 | Ht 68.0 in | Wt 167.0 lb

## 2021-04-17 DIAGNOSIS — M19011 Primary osteoarthritis, right shoulder: Secondary | ICD-10-CM | POA: Diagnosis not present

## 2021-04-17 DIAGNOSIS — S46101A Unspecified injury of muscle, fascia and tendon of long head of biceps, right arm, initial encounter: Secondary | ICD-10-CM

## 2021-04-17 DIAGNOSIS — F411 Generalized anxiety disorder: Secondary | ICD-10-CM | POA: Diagnosis not present

## 2021-04-17 NOTE — Assessment & Plan Note (Signed)
Continued mild hypoechoic changes noted.  We will continue to monitor.  Worsening pain once again advanced imaging may be warranted.  Follow-up again in 2 months

## 2021-04-17 NOTE — Assessment & Plan Note (Signed)
No significant swelling at this moment.  We will continue to monitor.  The patient does have the underlying arthritis.  Patient is 85% better.  Will still limited range of motion with worsening again advanced imaging could be warranted.

## 2021-04-17 NOTE — Patient Instructions (Signed)
Looks a lot better Keep monitoring but can increase slowly See me in 2-3 months Happy Holidays!

## 2021-05-01 DIAGNOSIS — F411 Generalized anxiety disorder: Secondary | ICD-10-CM | POA: Diagnosis not present

## 2021-06-22 DIAGNOSIS — E7800 Pure hypercholesterolemia, unspecified: Secondary | ICD-10-CM | POA: Diagnosis not present

## 2021-06-22 DIAGNOSIS — Z5181 Encounter for therapeutic drug level monitoring: Secondary | ICD-10-CM | POA: Diagnosis not present

## 2021-06-22 DIAGNOSIS — E559 Vitamin D deficiency, unspecified: Secondary | ICD-10-CM | POA: Diagnosis not present

## 2021-06-30 DIAGNOSIS — E7800 Pure hypercholesterolemia, unspecified: Secondary | ICD-10-CM | POA: Diagnosis not present

## 2021-06-30 DIAGNOSIS — E559 Vitamin D deficiency, unspecified: Secondary | ICD-10-CM | POA: Diagnosis not present

## 2021-07-07 NOTE — Progress Notes (Signed)
Wayne [name] [address] [place] Phone: [phone] Subjective:   Wayne [name], am serving as a scribe for Dr. Hulan [name]. This visit occurred during the SARS-CoV-2 public health emergency.  Safety protocols were in place, including screening questions prior to the visit, additional usage of staff PPE, and extensive cleaning of exam room while observing appropriate contact time as indicated for disinfecting solutions.   I'm seeing this patient by the request  of:  Pcp, No  CC: right shoulder pain f/u  [id number]  04/17/2021 Continued mild hypoechoic changes noted.  We will continue to monitor.  Worsening pain once again advanced imaging may be warranted.  Follow-up again in 2 months  No significant swelling at this moment.  We will continue to monitor.  The patient does have the underlying arthritis.  Patient is 85% better.  Will still limited range of motion with worsening again advanced imaging could be warranted.  Update 07/10/2021 Wayne [name] is a 61 y.o. male coming in with complaint of R shoulder pain. Patient states no pain. No new complaints.      Past Medical History:  Diagnosis Date   Allergy    seasonal  allergies   GERD (gastroesophageal reflux disease)    Heart murmur    Hyperlipidemia    Migraine, unspecified, without mention of intractable migraine without mention of status migrainosus 11/24/2013   Migraines    Past Surgical History:  Procedure Laterality Date   COLONOSCOPY     POLYPECTOMY     ruptured right achilles repair  02/22/2010   Social History   Socioeconomic History   Marital status: Single    Spouse name: Not on file   Number of children: Not on file   Years of education: Not on file   Highest education level: Not on file  Occupational History   Occupation: sales, some college  Tobacco Use   Smoking status: Never   Smokeless tobacco: Never  Substance and Sexual Activity   Alcohol  use: Yes    Alcohol/week: 3.0 standard drinks    Types: 3 Cans of beer per week   Drug use: No   Sexual activity: Not on file  Other Topics Concern   Not on file  Social History Narrative   Not on file   Social Determinants of Health   Financial Resource Strain: Not on file  Food Insecurity: Not on file  Transportation Needs: Not on file  Physical Activity: Not on file  Stress: Not on file  Social Connections: Not on file   No Known Allergies Family History  Problem Relation Age of Onset   Hyperlipidemia Mother    Cancer Mother        lung cancer   Colon cancer Neg Hx    Stomach cancer Neg Hx    Colon polyps Neg Hx    Esophageal cancer Neg Hx    Rectal cancer Neg Hx       Current Outpatient Medications (Cardiovascular):    pravastatin (PRAVACHOL) 20 MG tablet, Take 20 mg by mouth daily.      Current Outpatient Medications (Analgesics):    aspirin EC 81 MG tablet, Take 1 tablet (81 mg total) by mouth daily.   SUMAtriptan (IMITREX) 100 MG tablet, TAKE 1 TABLET (100 MG TOTAL) BY MOUTH EVERY 2 (TWO) HOURS AS NEEDED FORMIGRAINE     Current Outpatient Medications (Other):    aluminum chloride (DRYSOL) 20 % external solution, Apply topically at bedtime.  bisacodyl (DULCOLAX) 5 MG EC tablet, Take 5 mg by mouth daily as needed for moderate constipation.   cholecalciferol (VITAMIN D) 1000 UNITS tablet, Take 1,000 Units by mouth daily.   Coenzyme Q10 (COQ10 PO), Take by mouth.   cyclobenzaprine (FLEXERIL) 10 MG tablet, Take 1 tablet (10 mg total) by mouth 3 (three) times daily as needed for muscle spasms.   Ginger, Zingiber officinalis, (GINGER PO), Take by mouth daily.   Misc Natural Products (GINSENG COMPLEX PO), Take by mouth daily.   Multiple Vitamin (MULTIVITAMIN) tablet, Take 1 tablet by mouth daily.   Omega-3 Fatty Acids (OMEGA-3 FISH OIL PO), Take by mouth.   polyethylene glycol powder (GLYCOLAX/MIRALAX) 17 GM/SCOOP powder, Take 1 Container by mouth once.    ranitidine (ZANTAC) 150 MG tablet, Take 1 tablet (150 mg total) by mouth at bedtime.  Current Facility-Administered Medications (Other):    0.9 %  sodium chloride infusion    Objective  Blood pressure 104/76, pulse 67, height 5\' 8"  (1.727 m), weight 168 lb (76.2 kg), SpO2 98 %.   General: No apparent distress alert and oriented x3 mood and affect normal, dressed appropriately.  HEENT: Pupils equal, extraocular movements intact  Respiratory: Patient's speak in full sentences and does not appear short of breath  Cardiovascular: No lower extremity edema, non tender, no erythema  Gait normal with good balance and coordination.  Right shoulder exam shows mild impingement noted.  Mild positive speeds test.  Good range of motion otherwise.  Limited muscular skeletal ultrasound was performed and interpreted by Wayne [name], M  Limited ultrasound shows hypoechoic changes in the bicep tendon sheath.  No abnormal neovascularization. Patient does have a subacromial bursitis noted to with hypoechoic changes causing distortion of the rotator cuff but no true tear appreciated.    Impression and Recommendations:     The above documentation has been reviewed and is accurate and complete Wayne Pulley, DO

## 2021-07-10 ENCOUNTER — Other Ambulatory Visit: Payer: Self-pay

## 2021-07-10 ENCOUNTER — Ambulatory Visit (INDEPENDENT_AMBULATORY_CARE_PROVIDER_SITE_OTHER): Payer: BC Managed Care – PPO | Admitting: Family Medicine

## 2021-07-10 DIAGNOSIS — S46101A Unspecified injury of muscle, fascia and tendon of long head of biceps, right arm, initial encounter: Secondary | ICD-10-CM | POA: Diagnosis not present

## 2021-07-10 DIAGNOSIS — F411 Generalized anxiety disorder: Secondary | ICD-10-CM | POA: Diagnosis not present

## 2021-07-10 NOTE — Assessment & Plan Note (Addendum)
Patient quickly on ultrasound does show some swelling still noted.  Discussed with patient about icing regimen and home exercises, we discussed potential compression.  Concern with patient continuing to have recurrent swelling that we need to consider the possibility of advanced imaging for labral pathology.  Patient at this point though is not having any pains we will continue to monitor.  Follow-up with me in 2 to 3 months

## 2021-07-10 NOTE — Patient Instructions (Signed)
Great to see you Still inflammation in there so use ice from time to time Okay to increase activity See you again in 3 months

## 2021-08-28 DIAGNOSIS — F411 Generalized anxiety disorder: Secondary | ICD-10-CM | POA: Diagnosis not present

## 2021-10-10 NOTE — Progress Notes (Signed)
[place] [place] Phone: [phone] Subjective:   Wayne [name], am serving as a scribe for Dr. Hulan Saas.  This visit occurred during the SARS-CoV-2 public health emergency.  Safety protocols were in place, including screening questions prior to the visit, additional usage of staff PPE, and extensive cleaning of exam room while observing appropriate contact time as indicated for disinfecting solutions.    I'm seeing this patient by the request  of:  Pcp, [name]  CC: Right shoulder pain follow-up  [id number]  07/10/2021 Patient quickly on ultrasound does show some swelling still noted.  Discussed with patient about icing regimen and home exercises, we discussed potential compression.  Concern with patient continuing to have recurrent swelling that we need to consider the possibility of advanced imaging for labral pathology.  Patient at this point though is not having any pains we will continue to monitor.  Follow-up with me in 2 to 3 months  Update 10/11/2021 Wayne [name] is a 61 y.o. male coming in with complaint of R shoulder pain. Patient states that  Overall doing much better.  States that he is 100% better.  As he is able to workout on a regular basis and is having [name] pain at night.      Past Medical History:  Diagnosis Date   Allergy    seasonal  allergies   GERD (gastroesophageal reflux disease)    Heart murmur    Hyperlipidemia    Migraine, unspecified, without mention of intractable migraine without mention of status migrainosus 11/24/2013   Migraines    Past Surgical History:  Procedure Laterality Date   COLONOSCOPY     POLYPECTOMY     ruptured right achilles repair  02/22/2010   Social History   Socioeconomic History   Marital status: Single    Spouse name: Not on file   Number of children: Not on file   Years of education: Not on file   Highest education level: Not on file  Occupational  History   Occupation: sales, some college  Tobacco Use   Smoking status: Never   Smokeless tobacco: Never  Substance and Sexual Activity   Alcohol use: Yes    Alcohol/week: 3.0 standard drinks    Types: 3 Cans of beer per week   Drug use: [name]   Sexual activity: Not on file  Other Topics Concern   Not on file  Social History Narrative   Not on file   Social Determinants of Health   Financial Resource Strain: Not on file  Food Insecurity: Not on file  Transportation Needs: Not on file  Physical Activity: Not on file  Stress: Not on file  Social Connections: Not on file   [name] Known Allergies Family History  Problem Relation Age of Onset   Hyperlipidemia Mother    Cancer Mother        lung cancer   Colon cancer Neg Hx    Stomach cancer Neg Hx    Colon polyps Neg Hx    Esophageal cancer Neg Hx    Rectal cancer Neg Hx       Current Outpatient Medications (Cardiovascular):    pravastatin (PRAVACHOL) 20 MG tablet, Take 20 mg by mouth daily.      Current Outpatient Medications (Analgesics):    aspirin EC 81 MG tablet, Take 1 tablet (81 mg total) by mouth daily.   SUMAtriptan (IMITREX) 100 MG tablet, TAKE 1 TABLET (100 MG TOTAL)  BY MOUTH EVERY 2 (TWO) HOURS AS NEEDED FORMIGRAINE     Current Outpatient Medications (Other):    aluminum chloride (DRYSOL) 20 % external solution, Apply topically at bedtime.   bisacodyl (DULCOLAX) 5 MG EC tablet, Take 5 mg by mouth daily as needed for moderate constipation.   cholecalciferol (VITAMIN D) 1000 UNITS tablet, Take 1,000 Units by mouth daily.   Coenzyme Q10 (COQ10 PO), Take by mouth.   cyclobenzaprine (FLEXERIL) 10 MG tablet, Take 1 tablet (10 mg total) by mouth 3 (three) times daily as needed for muscle spasms.   Ginger, Zingiber officinalis, (GINGER PO), Take by mouth daily.   Misc Natural Products (GINSENG COMPLEX PO), Take by mouth daily.   Multiple Vitamin (MULTIVITAMIN) tablet, Take 1 tablet by mouth daily.   Omega-3  Fatty Acids (OMEGA-3 FISH OIL PO), Take by mouth.   polyethylene glycol powder (GLYCOLAX/MIRALAX) 17 GM/SCOOP powder, Take 1 Container by mouth once.   ranitidine (ZANTAC) 150 MG tablet, Take 1 tablet (150 mg total) by mouth at bedtime.  Current Facility-Administered Medications (Other):    0.9 %  sodium chloride infusion   Reviewed prior external information including notes and imaging from  primary care provider As well as notes that were available from care everywhere and other healthcare systems.  Past medical history, social, surgical and family history all reviewed in electronic medical record.  [name] pertanent information unless stated regarding to the chief complaint.   Review of Systems:  [name] headache, visual changes, nausea, vomiting, diarrhea, constipation, dizziness, abdominal pain, skin rash, fevers, chills, night sweats, weight loss, swollen lymph nodes, body aches, joint swelling, chest pain, shortness of breath, mood changes. POSITIVE muscle aches  Objective  Blood pressure 106/80, pulse 67, height '5\' 8"'$  (1.727 m), weight 164 lb (74.4 kg), SpO2 99 %.   General: [name] apparent distress alert and oriented x3 mood and affect normal, dressed appropriately.  HEENT: Pupils equal, extraocular movements intact  Respiratory: Patient's speak in full sentences and does not appear short of breath  Cardiovascular: [name] lower extremity edema, non tender, [name] erythema  Gait normal with good balance and coordination.  MSK: Right shoulder exam shows full range of motion noted.  Very mild discomfort with speeds test.  5/5 strength of the rotator cuff though noted.  Limited muscular skeletal ultrasound was performed and interpreted by Hulan Saas, M  Limited ultrasound shows some very mild hypoechoic changes within the bicep tendon sheath but otherwise fairly unremarkable except for the supraspinatus that does have a area that does seem to be a potential inclusion cyst Impression: Chronic  supraspinatus questionable intersubstance tearing that has now healed with a cyst, mild acromioclavicular arthritis, and biceps tendinopathy    Impression and Recommendations:     The above documentation has been reviewed and is accurate and complete Lyndal Pulley, DO

## 2021-10-16 ENCOUNTER — Ambulatory Visit: Payer: BC Managed Care – PPO | Admitting: Family Medicine

## 2021-10-16 ENCOUNTER — Ambulatory Visit: Payer: Self-pay

## 2021-10-16 ENCOUNTER — Encounter: Payer: Self-pay | Admitting: Family Medicine

## 2021-10-16 VITALS — BP 106/80 | HR 67 | Ht 68.0 in | Wt 164.0 lb

## 2021-10-16 DIAGNOSIS — M25511 Pain in right shoulder: Secondary | ICD-10-CM

## 2021-10-16 DIAGNOSIS — S46101A Unspecified injury of muscle, fascia and tendon of long head of biceps, right arm, initial encounter: Secondary | ICD-10-CM | POA: Diagnosis not present

## 2021-10-16 NOTE — Patient Instructions (Signed)
You are the man! Stay active and travel safe  You know the drill  See me again in 3 months

## 2021-11-12 IMAGING — DX DG SHOULDER 2+V*R*
3 series · 3 of 3 positions shown · non-contrast
Comparison: None.

CLINICAL DATA: Right shoulder pain for 2 months.  No known injury.

EXAM:
RIGHT SHOULDER - 2+ VIEW

[shoulder ap (1 of 2)]
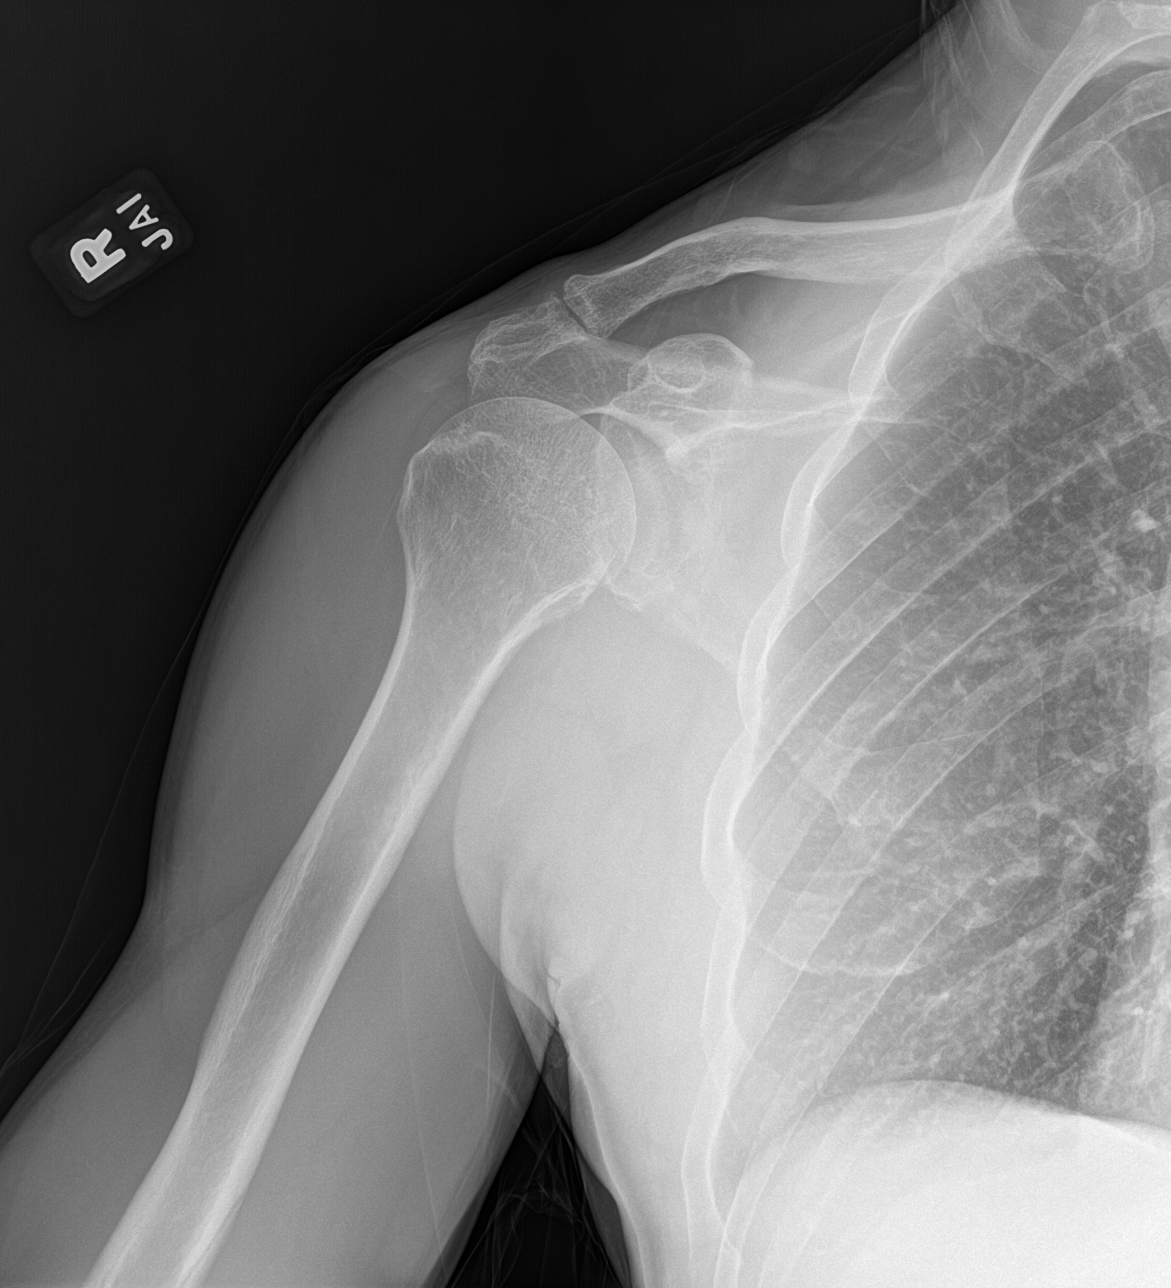

[shoulder ap (2 of 2)]
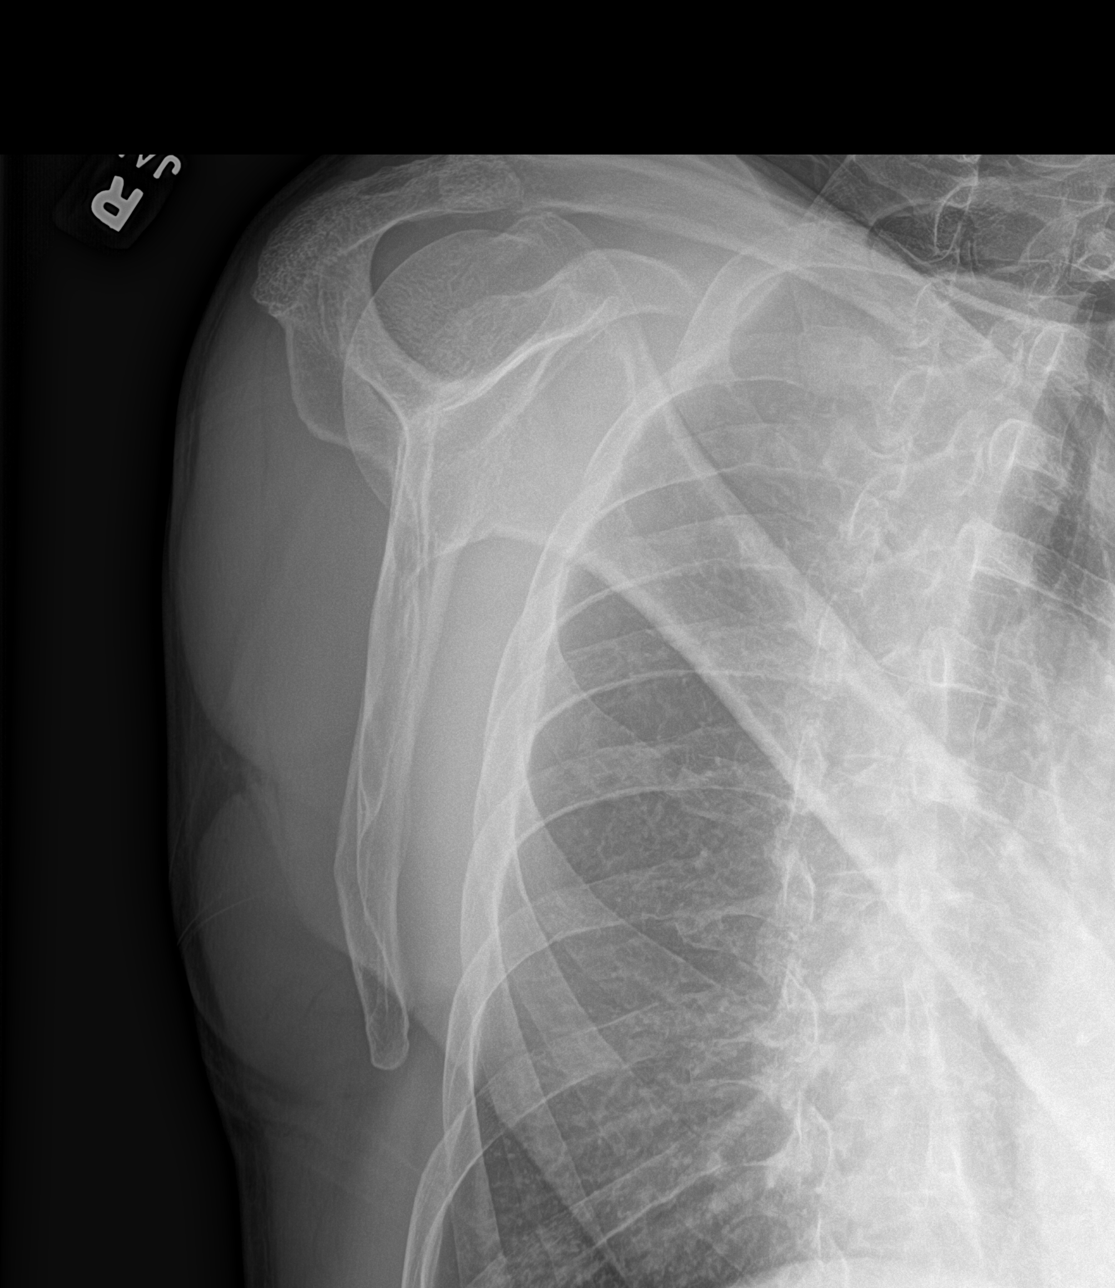

[shoulder axial]
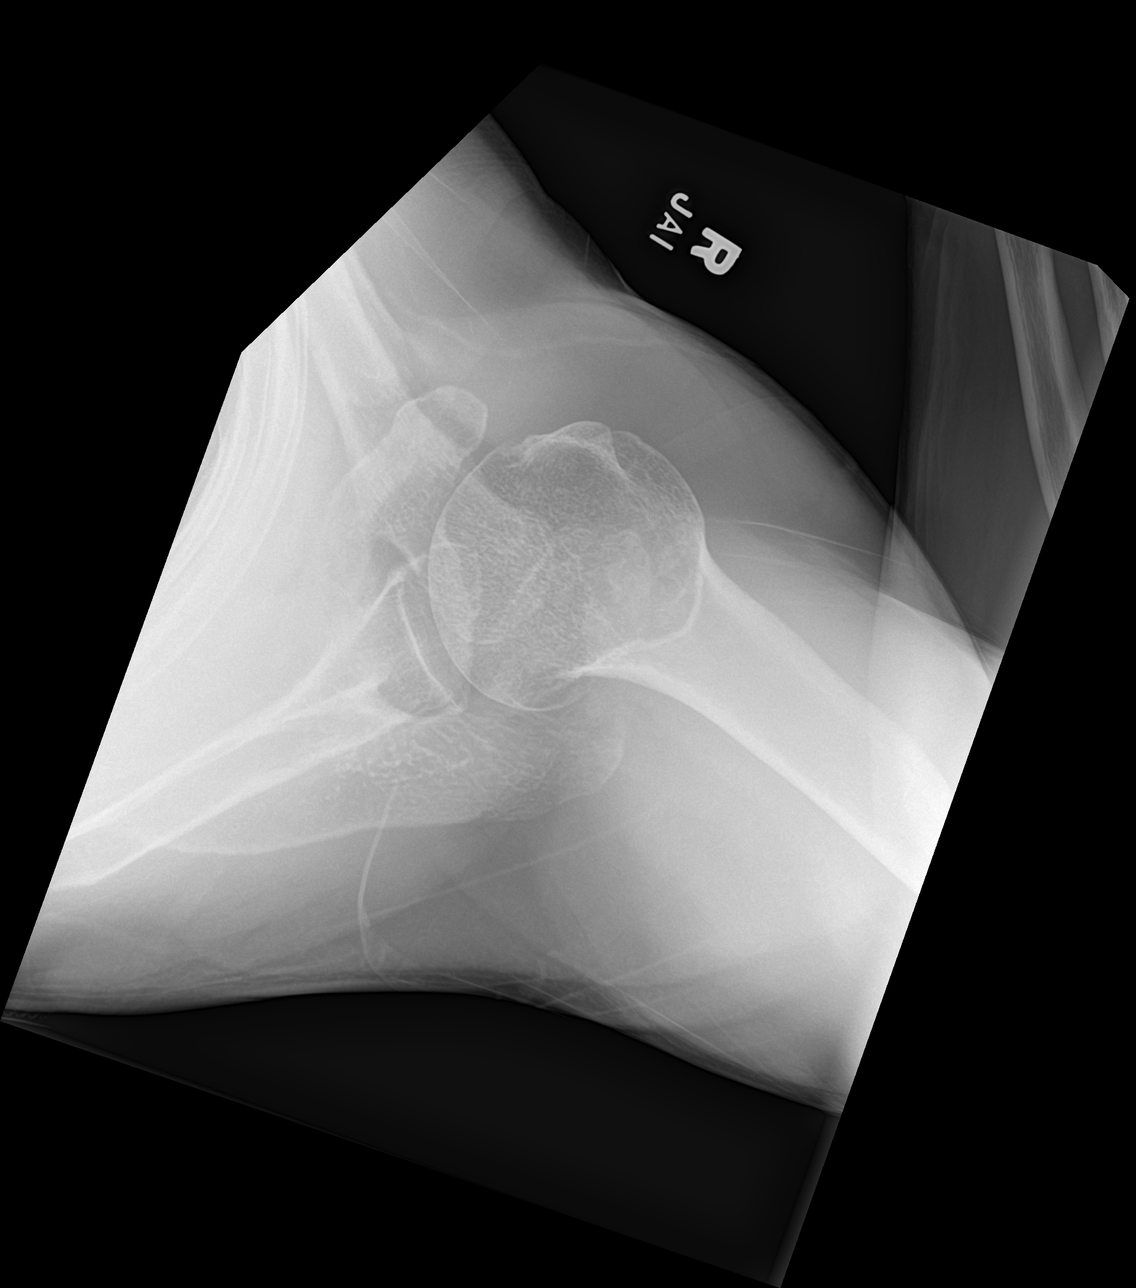

[3 of 3 positions shown; findings below may reference images not displayed]

FINDINGS: There is no evidence of fracture or dislocation. Minimal
acromioclavicular spurring and subchondral cystic change. Trace
inferior glenohumeral spurring. Tiny subacromial spur. No evidence
of avascular necrosis, erosion, or focal bone abnormality. Soft
tissues are unremarkable.
IMPRESSION: 1. Minimal acromioclavicular and glenohumeral osteoarthritis.
2. Tiny subacromial spur.

## 2021-11-17 DIAGNOSIS — M79645 Pain in left finger(s): Secondary | ICD-10-CM | POA: Diagnosis not present

## 2021-11-29 DIAGNOSIS — M79645 Pain in left finger(s): Secondary | ICD-10-CM | POA: Diagnosis not present

## 2021-11-29 DIAGNOSIS — S62633A Displaced fracture of distal phalanx of left middle finger, initial encounter for closed fracture: Secondary | ICD-10-CM | POA: Diagnosis not present

## 2022-01-10 NOTE — Progress Notes (Signed)
[facility] [place] Phone: [phone] Subjective:   Rito Ehrlich, am serving as a scribe for Dr. Hulan Saas.  I'm seeing this patient by the request  of:  Pcp, No  CC: Right shoulder pain follow-up  [id number]  Wayne [name] is a 61 y.o. male coming in with complaint of R shoulder pain. Last seen May 2023. Patient states that his shoulder is perfect no complaints.      Past Medical History:  Diagnosis Date   Allergy    seasonal  allergies   GERD (gastroesophageal reflux disease)    Heart murmur    Hyperlipidemia    Migraine, unspecified, without mention of intractable migraine without mention of status migrainosus 11/24/2013   Migraines    Past Surgical History:  Procedure Laterality Date   COLONOSCOPY     POLYPECTOMY     ruptured right achilles repair  02/22/2010   Social History   Socioeconomic History   Marital status: Single    Spouse name: Not on file   Number of children: Not on file   Years of education: Not on file   Highest education level: Not on file  Occupational History   Occupation: sales, some college  Tobacco Use   Smoking status: Never   Smokeless tobacco: Never  Substance and Sexual Activity   Alcohol use: Yes    Alcohol/week: 3.0 standard drinks of alcohol    Types: 3 Cans of beer per week   Drug use: No   Sexual activity: Not on file  Other Topics Concern   Not on file  Social History Narrative   Not on file   Social Determinants of Health   Financial Resource Strain: Not on file  Food Insecurity: Not on file  Transportation Needs: Not on file  Physical Activity: Not on file  Stress: Not on file  Social Connections: Not on file   No Known Allergies Family History  Problem Relation Age of Onset   Hyperlipidemia Mother    Cancer Mother        lung cancer   Colon cancer Neg Hx    Stomach cancer Neg Hx    Colon polyps Neg Hx    Esophageal cancer Neg Hx     Rectal cancer Neg Hx       Current Outpatient Medications (Cardiovascular):    pravastatin (PRAVACHOL) 20 MG tablet, Take 20 mg by mouth daily.      Current Outpatient Medications (Analgesics):    aspirin EC 81 MG tablet, Take 1 tablet (81 mg total) by mouth daily.   SUMAtriptan (IMITREX) 100 MG tablet, TAKE 1 TABLET (100 MG TOTAL) BY MOUTH EVERY 2 (TWO) HOURS AS NEEDED FORMIGRAINE     Current Outpatient Medications (Other):    aluminum chloride (DRYSOL) 20 % external solution, Apply topically at bedtime.   bisacodyl (DULCOLAX) 5 MG EC tablet, Take 5 mg by mouth daily as needed for moderate constipation.   cholecalciferol (VITAMIN D) 1000 UNITS tablet, Take 1,000 Units by mouth daily.   Coenzyme Q10 (COQ10 PO), Take by mouth.   cyclobenzaprine (FLEXERIL) 10 MG tablet, Take 1 tablet (10 mg total) by mouth 3 (three) times daily as needed for muscle spasms.   Ginger, Zingiber officinalis, (GINGER PO), Take by mouth daily.   Misc Natural Products (GINSENG COMPLEX PO), Take by mouth daily.   Multiple Vitamin (MULTIVITAMIN) tablet, Take 1 tablet by mouth daily.   Omega-3 Fatty Acids (OMEGA-3 FISH  OIL PO), Take by mouth.   polyethylene glycol powder (GLYCOLAX/MIRALAX) 17 GM/SCOOP powder, Take 1 Container by mouth once.   ranitidine (ZANTAC) 150 MG tablet, Take 1 tablet (150 mg total) by mouth at bedtime.  Current Facility-Administered Medications (Other):    0.9 %  sodium chloride infusion     Objective  Blood pressure 112/64, pulse 74, height '5\' 8"'$  (1.727 m), weight 165 lb (74.8 kg), SpO2 95 %.   General: No apparent distress alert and oriented x3 mood and affect normal, dressed appropriately.  HEENT: Pupils equal, extraocular movements intact  Respiratory: Patient's speak in full sentences and does not appear short of breath  Cardiovascular: No lower extremity edema, non tender, no erythema  Right shoulder exam shows some very mild discomfort with the bicep but nothing  significant.  5 out of 5 strength noted.  Negative straight leg test noted negative Neer and Hawkins.    Impression and Recommendations:    The above documentation has been reviewed and is accurate and complete Lyndal Pulley, DO

## 2022-01-16 ENCOUNTER — Ambulatory Visit: Payer: BC Managed Care – PPO | Admitting: Family Medicine

## 2022-01-16 DIAGNOSIS — M19011 Primary osteoarthritis, right shoulder: Secondary | ICD-10-CM | POA: Diagnosis not present

## 2022-01-16 NOTE — Patient Instructions (Signed)
Good to see you   

## 2022-01-16 NOTE — Assessment & Plan Note (Signed)
Patient is doing remarkably well at this point.  Discussed with patient icing regimen and home exercises.  Very mild hypoechoic changes noted on the ultrasound today but with patient feeling so good follow-up as needed

## 2022-04-02 DIAGNOSIS — R5383 Other fatigue: Secondary | ICD-10-CM | POA: Diagnosis not present

## 2022-04-02 DIAGNOSIS — Z23 Encounter for immunization: Secondary | ICD-10-CM | POA: Diagnosis not present

## 2022-04-02 DIAGNOSIS — Z0000 Encounter for general adult medical examination without abnormal findings: Secondary | ICD-10-CM | POA: Diagnosis not present

## 2022-04-02 DIAGNOSIS — R011 Cardiac murmur, unspecified: Secondary | ICD-10-CM | POA: Diagnosis not present

## 2022-11-30 ENCOUNTER — Encounter: Payer: Self-pay | Admitting: Internal Medicine

## 2024-04-17 ENCOUNTER — Encounter: Payer: Self-pay | Admitting: Podiatry

## 2024-04-17 ENCOUNTER — Ambulatory Visit: Admitting: Podiatry

## 2024-04-17 VITALS — Ht 68.0 in | Wt 165.0 lb

## 2024-04-17 DIAGNOSIS — L600 Ingrowing nail: Secondary | ICD-10-CM | POA: Diagnosis not present

## 2024-04-17 DIAGNOSIS — B351 Tinea unguium: Secondary | ICD-10-CM | POA: Diagnosis not present

## 2024-04-17 NOTE — Patient Instructions (Signed)

## 2024-04-19 NOTE — Progress Notes (Signed)
 Subjective:   Patient ID: Wayne [name], male   DOB: 63 y.o.   MRN: [id number]   HPI Patient presents with painful ingrown toenail left big toe that has been incurvated and hard to wear shoe gear with.  States he is tried to trim it and soak it without relief and it is discolored.  Patient does not smoke likes to be active   Review of Systems  All other systems reviewed and are negative.       Objective:  Physical Exam Vitals and nursing note reviewed.  Constitutional:      Appearance: He is well-developed.  Pulmonary:     Effort: Pulmonary effort is normal.  Musculoskeletal:        General: Normal range of motion.  Skin:    General: Skin is warm.  Neurological:     Mental Status: He is alert.     Neurovascular status intact muscle strength was found to be adequate incurvation of the hallux nail left medial and lateral border is present with discoloration of the hallux nail secondary to trauma.  Patient has had this structural deformity of his nailbeds for a long time has good digital perfusion well-oriented x 3     Assessment:  Ingrown toenail deformity left hallux medial lateral border with pain with chronic discoloration hallux nails bilateral     Plan:  H&P both conditions reviewed explained and at this point I have recommended correction of deformity and explained procedure risk and that he may eventually lose the entire nail.  Patient wants surgery understanding and signed consent form given to him and at this point I anesthetized the left big toe 60 mg Xylocaine Marcaine mixture sterile prep done using sterile instrumentation removed the medial and lateral borders of the left big toe exposed matrix applied phenol 3 applications 30 seconds followed by alcohol lavage sterile dressing gave instructions on soaks and wear dressing 24 hours take off earlier if throbbing were to occur and encouraged to call with questions concerns which may arise

## 2024-06-03 ENCOUNTER — Encounter: Payer: Self-pay | Admitting: Gastroenterology

## 2024-06-29 ENCOUNTER — Ambulatory Visit: Admitting: Gastroenterology

## 2024-07-03 ENCOUNTER — Ambulatory Visit: Admitting: Nurse Practitioner

## 2024-07-03 ENCOUNTER — Encounter: Payer: Self-pay | Admitting: Nurse Practitioner

## 2024-07-03 VITALS — BP 110/60 | HR 83 | Ht 68.0 in | Wt 174.2 lb

## 2024-07-03 DIAGNOSIS — K625 Hemorrhage of anus and rectum: Secondary | ICD-10-CM

## 2024-07-03 DIAGNOSIS — K4090 Unilateral inguinal hernia, without obstruction or gangrene, not specified as recurrent: Secondary | ICD-10-CM

## 2024-07-03 DIAGNOSIS — Z860100 Personal history of colon polyps, unspecified: Secondary | ICD-10-CM

## 2024-07-03 MED ORDER — NA SULFATE-K SULFATE-MG SULF 17.5-3.13-1.6 GM/177ML PO SOLN: 1.0000 | Freq: Once | ORAL | 0 refills | Status: AC

## 2024-07-03 NOTE — Progress Notes (Signed)
 "    07/03/2024 Wayne [name] [id number] July 29, 1960  PRIMARY GASTROENTEROLOGIST: Dr. Albertus   CHIEF COMPLAINT: Blood in stool, RLQ pain  HISTORY OF PRESENT ILLNESS: Wayne [name] is a 64 year old male with a past medical history of hyperlipidemia, migraine headaches, GERD and colon polyps.   Discussed the use of AI scribe software for clinical note transcription with the patient, who gave verbal consent to proceed.  History of Present Illness Wayne [name] is a 64 year old male with a history of colon polyps who was referred by Dr. Kerney [name] for further evaluation regarding blood in stool and abdominal pain.  Intermittent rectal bleeding began in December 2025 and recurred on June 17, 2024. The bleeding is described as bright red, primarily seen on tissue when wiping, and not in the toilet water or on the stool. No current active bleeding reported. Bowel movements are daily and easy, with no constipation.  He reported completing a Cologuard test in December 2025 which was negative.    Colonoscopy on October 30, 2017, identified a hyperplastic polyp. He was initially advised to repeat colonoscopy in five years, but subsequently received a letter indicating the next colonoscopy is due in 2029.  Colonoscopy 01/2012 identified 1 to 2 mm tubular adenomatous polyp removed from the colon.  No known family history of colon cancer.  He developed RLQ/groin pain associated bulge that could be manually reduced and was seen by his PCP. CTAP with contrast on May 19, 2024, showed a small fat-containing right hernia and diverticulosis, without evidence of diverticulitis or bowel obstruction. He was not previously aware of these CT findings.  Labs 04/02/2024: WBC 5.8.  Hemoglobin 16.9.  Hematocrit 52.6.  Platelets 250.  Sodium 141.  Potassium 4.5.  Creatinine 0.98.  Alk phos 76.  AST 26.  ALT 33.  GI PROCEDURES:   Colonoscopy 10/30/2017: - One 4 mm polyp in the descending colon, removed with a cold  snare. Resected and retrieved.  - Moderate diverticulosis in the sigmoid colon and in the descending colon.  - Internal hemorrhoids - Recall colonoscopy 5 years due to prior history of adenomatous polyp Surgical [P], descending, polyp - HYPERPLASTIC POLYP  Colonoscopy 02/22/2012: - 2 mm polyp removed from the ascending colon -Internal and external hemorrhoids Surgical Surgical [P], ascending, ascending, polyp - TUBULAR TUBULAR ADENOMA. ADENOMA. - HIGH GRADE DYSPLASIA DYSPLASIA IS NOT IDENTIFIED. IDENTIFIED. Wayne [name]   Past Medical History:  Diagnosis Date   Allergy    seasonal  allergies   GERD (gastroesophageal reflux disease)    Heart murmur    Hyperlipidemia    Migraine, unspecified, without mention of intractable migraine without mention of status migrainosus 11/24/2013   Migraines    Past Surgical History:  Procedure Laterality Date   COLONOSCOPY     POLYPECTOMY     ruptured right achilles repair  02/22/2010   Social History:  Nonsmoker. He has never used smokeless tobacco. He reports current alcohol use of about 3.0 standard drinks of alcohol per week. He reports that he does not use drugs.  Family History: Mother with history of lung cancer.  No known family history of esophageal, gastric or colon cancer.  family history includes Cancer in his mother; Hyperlipidemia in his mother. Allergies[1]   Outpatient Encounter Medications as of 07/03/2024  Medication Sig   aluminum  chloride (DRYSOL) 20 % external solution Apply topically at bedtime.   aspirin  EC 81 MG tablet Take 1 tablet (81 mg total) by mouth daily.   bisacodyl (  DULCOLAX) 5 MG EC tablet Take 5 mg by mouth daily as needed for moderate constipation.   cholecalciferol (VITAMIN D) 1000 UNITS tablet Take 1,000 Units by mouth daily.   Coenzyme Q10 (COQ10 PO) Take by mouth.   cyclobenzaprine  (FLEXERIL ) 10 MG tablet Take 1 tablet (10 mg total) by mouth 3 (three) times daily as needed for muscle spasms.   Ginger,  Zingiber officinalis, (GINGER PO) Take by mouth daily.   Misc Natural Products (GINSENG COMPLEX PO) Take by mouth daily.   Multiple Vitamin (MULTIVITAMIN) tablet Take 1 tablet by mouth daily.   Omega-3 Fatty Acids (OMEGA-3 FISH OIL PO) Take by mouth.   polyethylene glycol powder (GLYCOLAX/MIRALAX) 17 GM/SCOOP powder Take 1 Container by mouth once.   pravastatin (PRAVACHOL) 20 MG tablet Take 20 mg by mouth daily.    ranitidine  (ZANTAC ) 150 MG tablet Take 1 tablet (150 mg total) by mouth at bedtime.   SUMAtriptan  (IMITREX ) 100 MG tablet TAKE 1 TABLET (100 MG TOTAL) BY MOUTH EVERY 2 (TWO) HOURS AS NEEDED FORMIGRAINE   Facility-Administered Encounter Medications as of 07/03/2024  Medication   0.9 %  sodium chloride  infusion   REVIEW OF SYSTEMS:  Gen: Denies fever, sweats or chills. No weight loss.  CV: Denies chest pain, palpitations or edema. Resp: Denies cough, shortness of breath of hemoptysis.  GI: See HPI. No GERD symptoms. GU: Denies urinary burning, blood in urine, increased urinary frequency or incontinence. MS: Denies joint pain, muscles aches or weakness. Derm: Denies rash, itchiness, skin lesions or unhealing ulcers. Psych: Denies depression, anxiety, memory loss or confusion. Heme: Denies bruising, easy bleeding. Neuro:  Denies headaches, dizziness or paresthesias. Endo:  Denies any problems with DM, thyroid or adrenal function.  PHYSICAL EXAM: BP 110/60   Pulse 83   Ht 5' 8 (1.727 m)   Wt 174 lb 4 oz (79 kg)   BMI 26.49 kg/m   General: 64 year old male in no acute distress. Head: Normocephalic and atraumatic. Eyes:  Sclerae non-icteric, conjunctive pink. Ears: Normal auditory acuity. Mouth: Dentition intact. No ulcers or lesions.  Neck: Supple, no lymphadenopathy or thyromegaly.  Lungs: Clear bilaterally to auscultation without wheezes, crackles or rhonchi. Heart: Regular rate and rhythm. No murmur, rub or gallop appreciated.  Abdomen: Soft, nondistended.  No  abdominal tenderness.  Mild tenderness to the right groin area without palpable mass when supine.  No hepatosplenomegaly. Normoactive bowel sounds x 4 quadrants.  Rectal: Patient declined exam, to proceed with colonoscopy for further evaluation. Musculoskeletal: Symmetrical with no gross deformities. Skin: Warm and dry. No rash or lesions on visible extremities. Extremities: No edema. Neurological: Alert oriented x 4, no focal deficits.  Psychological: Alert and cooperative. Normal mood and affect.  ASSESSMENT AND PLAN:  64 year old male with a history of internal hemorrhoids presents with bright red rectal bleeding, seen on the toilet tissue x 2 episodes which occurred on 05/12/2024 and 06/17/2024.  No associated constipation or anal rectal pain. - Diagnostic colonoscopy benefits and risks discussed including risk with sedation, risk of bleeding, perforation and infection  - Benefiber 1 tablespoon daily to avoid straining - MiraLAX nightly as needed to avoid constipation/straining - Apply a small amount of Desitin inside the anal opening and to the external anal area three times daily as needed for anal or hemorrhoidal irritation/bleeding.  - Patient instructed not to use Desitin the night before or day of colonoscopy  RLQ/Right groin pain. CTAP with contrast on May 19, 2024, showed a small fat-containing right hernia  -  Refer to general surgery   History of colon polyps.  His most recent colonoscopy 10/2017 identified one 4 mm hyperplastic polyp removed from the colon.  Colonoscopy 01/2012 identified one 2 mm tubular adenomatous polyp removed from the colon. - Colon polyp surveillance colonoscopy due 10/2027.  However, a diagnostic colonoscopy ordered above due to rectal bleeding and RLQ pain.  -  To further update colon polyp surveillance colonoscopy recall recommendations after diagnostic colonoscopy completed - I do not recommend Cologuard testing for patient with history of colon  polyps     CC:  Pia Wayne SQUIBB, MD       [1] No Known Allergies  "

## 2024-07-03 NOTE — Patient Instructions (Addendum)
 Apply a small amount of Desitin inside the anal opening and to the external anal area three times daily as needed for anal or hemorrhoidal irritation/bleeding.   Benefiber one tablespoon once daily as needed to avoid straining or constipation   Take Miralax 1 capful mixed in 8 ounces of water at bed time for constipation as tolerated  Contact our office if your rectal bleeding worsens prior to your colonoscopy date   We have sent the following medications to your pharmacy for you to pick up at your convenience: Suprep  A referral will be sent to [facility]. Please call them in 2 weeks if you haven't heard regarding an appointment.  You have been scheduled for a colonoscopy. Please follow written instructions given to you at your visit today.   If you use inhalers (even only as needed), please bring them with you on the day of your procedure.  DO NOT TAKE 7 DAYS PRIOR TO TEST- Trulicity (dulaglutide) Ozempic, Wegovy (semaglutide) Mounjaro, Zepbound (tirzepatide) Bydureon Bcise (exanatide extended release)  DO NOT TAKE 1 DAY PRIOR TO YOUR TEST Rybelsus (semaglutide) Adlyxin (lixisenatide) Victoza (liraglutide) Byetta (exanatide) ___________________________________________________________________________  Thank you for trusting me with your gastrointestinal care!   Elida Shawl, CRNP

## 2024-07-16 ENCOUNTER — Encounter: Admitting: Internal Medicine
